# Patient Record
Sex: Female | Born: 1965 | Race: Black or African American | Hispanic: No | Marital: Single | Smoking: Never smoker
Health system: Southern US, Community
[De-identification: ages and names within clinical notes are randomized; demographics above are authoritative.]

## PROBLEM LIST (undated history)

## (undated) DIAGNOSIS — E785 Hyperlipidemia, unspecified: Secondary | ICD-10-CM

## (undated) DIAGNOSIS — K819 Cholecystitis, unspecified: Secondary | ICD-10-CM

## (undated) DIAGNOSIS — I10 Essential (primary) hypertension: Secondary | ICD-10-CM

## (undated) HISTORY — PX: TUBAL LIGATION: SHX77

## (undated) HISTORY — PX: ABDOMINAL HYSTERECTOMY: SHX81

## (undated) HISTORY — DX: Hyperlipidemia, unspecified: E78.5

---

## 2017-08-31 DIAGNOSIS — K819 Cholecystitis, unspecified: Secondary | ICD-10-CM

## 2017-08-31 HISTORY — DX: Cholecystitis, unspecified: K81.9

## 2017-09-23 ENCOUNTER — Encounter (HOSPITAL_COMMUNITY): Payer: Self-pay

## 2017-09-23 ENCOUNTER — Emergency Department (HOSPITAL_COMMUNITY): Payer: PRIVATE HEALTH INSURANCE

## 2017-09-23 ENCOUNTER — Observation Stay (HOSPITAL_COMMUNITY)
Admission: EM | Admit: 2017-09-23 | Discharge: 2017-09-26 | Disposition: A | Discharge status: Home / Self Care | Payer: PRIVATE HEALTH INSURANCE | Attending: Surgery | Admitting: Surgery

## 2017-09-23 DIAGNOSIS — R1011 Right upper quadrant pain: Secondary | ICD-10-CM

## 2017-09-23 DIAGNOSIS — K810 Acute cholecystitis: Secondary | ICD-10-CM | POA: Diagnosis present

## 2017-09-23 DIAGNOSIS — R740 Nonspecific elevation of levels of transaminase and lactic acid dehydrogenase [LDH]: Secondary | ICD-10-CM | POA: Insufficient documentation

## 2017-09-23 DIAGNOSIS — K8020 Calculus of gallbladder without cholecystitis without obstruction: Secondary | ICD-10-CM

## 2017-09-23 DIAGNOSIS — Z01818 Encounter for other preprocedural examination: Secondary | ICD-10-CM

## 2017-09-23 DIAGNOSIS — I10 Essential (primary) hypertension: Secondary | ICD-10-CM | POA: Insufficient documentation

## 2017-09-23 DIAGNOSIS — K8010 Calculus of gallbladder with chronic cholecystitis without obstruction: Principal | ICD-10-CM | POA: Insufficient documentation

## 2017-09-23 HISTORY — DX: Essential (primary) hypertension: I10

## 2017-09-23 HISTORY — DX: Cholecystitis, unspecified: K81.9

## 2017-09-23 LAB — CBC
HCT: 40.3 % (ref 36.0–46.0)
Hemoglobin: 13.2 g/dL (ref 12.0–15.0)
MCH: 27 pg (ref 26.0–34.0)
MCHC: 32.8 g/dL (ref 30.0–36.0)
MCV: 82.4 fL (ref 78.0–100.0)
PLATELETS: 296 10*3/uL (ref 150–400)
RBC: 4.89 MIL/uL (ref 3.87–5.11)
RDW: 15.1 % (ref 11.5–15.5)
WBC: 8 10*3/uL (ref 4.0–10.5)

## 2017-09-23 LAB — COMPREHENSIVE METABOLIC PANEL
ALBUMIN: 3.8 g/dL (ref 3.5–5.0)
ALK PHOS: 73 U/L (ref 38–126)
ALT: 239 U/L — ABNORMAL HIGH (ref 14–54)
AST: 345 U/L — AB (ref 15–41)
Anion gap: 8 (ref 5–15)
BILIRUBIN TOTAL: 2.7 mg/dL — AB (ref 0.3–1.2)
BUN: 10 mg/dL (ref 6–20)
CALCIUM: 9.1 mg/dL (ref 8.9–10.3)
CO2: 24 mmol/L (ref 22–32)
CREATININE: 0.9 mg/dL (ref 0.44–1.00)
Chloride: 103 mmol/L (ref 101–111)
GFR calc Af Amer: >60 mL/min (ref >60)
GFR calc non Af Amer: >60 mL/min (ref >60)
GLUCOSE: 116 mg/dL — AB (ref 65–99)
Potassium: 3.5 mmol/L (ref 3.5–5.1)
Sodium: 135 mmol/L (ref 135–145)
TOTAL PROTEIN: 7.3 g/dL (ref 6.5–8.1)

## 2017-09-23 LAB — I-STAT BETA HCG BLOOD, ED (MC, WL, AP ONLY)

## 2017-09-23 LAB — LIPASE, BLOOD: Lipase: 31 U/L (ref 11–51)

## 2017-09-23 MED ORDER — HYDROCHLOROTHIAZIDE 25 MG PO TABS
25.0000 mg | ORAL_TABLET | Freq: Every day | ORAL | Status: DC
  Administered 2017-09-23 – 2017-09-26 (×4): 25 mg via ORAL
  Filled 2017-09-23 (×4): qty 1

## 2017-09-23 MED ORDER — PROCHLORPERAZINE EDISYLATE 10 MG/2ML IJ SOLN: 5.0000 mg | Freq: Four times a day (QID) | INTRAMUSCULAR | Status: DC | PRN

## 2017-09-23 MED ORDER — PROCHLORPERAZINE MALEATE 10 MG PO TABS
10.0000 mg | ORAL_TABLET | Freq: Four times a day (QID) | ORAL | Status: DC | PRN
  Administered 2017-09-23: 10 mg via ORAL
  Filled 2017-09-23: qty 2
  Filled 2017-09-23: qty 1

## 2017-09-23 MED ORDER — LISINOPRIL 40 MG PO TABS
40.0000 mg | ORAL_TABLET | Freq: Every day | ORAL | Status: DC
  Administered 2017-09-23 – 2017-09-26 (×4): 40 mg via ORAL
  Filled 2017-09-23: qty 1
  Filled 2017-09-23: qty 2
  Filled 2017-09-23 (×2): qty 1

## 2017-09-23 MED ORDER — ONDANSETRON 4 MG PO TBDP
4.0000 mg | ORAL_TABLET | Freq: Once | ORAL | Status: AC
  Administered 2017-09-23: 4 mg via ORAL
  Filled 2017-09-23: qty 1

## 2017-09-23 MED ORDER — SODIUM CHLORIDE 0.9 % IV SOLN
2.0000 g | INTRAVENOUS | Status: DC
  Administered 2017-09-23 – 2017-09-24 (×2): 2 g via INTRAVENOUS
  Filled 2017-09-23 (×3): qty 20

## 2017-09-23 MED ORDER — KCL IN DEXTROSE-NACL 20-5-0.45 MEQ/L-%-% IV SOLN
INTRAVENOUS | Status: DC
  Administered 2017-09-23 – 2017-09-26 (×8): via INTRAVENOUS
  Filled 2017-09-23 (×8): qty 1000

## 2017-09-23 MED ORDER — ENOXAPARIN SODIUM 40 MG/0.4ML ~~LOC~~ SOLN: 40.0000 mg | SUBCUTANEOUS | Status: DC

## 2017-09-23 MED ORDER — ONDANSETRON HCL 4 MG/2ML IJ SOLN
4.0000 mg | Freq: Four times a day (QID) | INTRAMUSCULAR | Status: DC | PRN
  Administered 2017-09-23 – 2017-09-25 (×2): 4 mg via INTRAVENOUS
  Filled 2017-09-23 (×2): qty 2

## 2017-09-23 MED ORDER — FAMOTIDINE IN NACL 20-0.9 MG/50ML-% IV SOLN
20.0000 mg | Freq: Two times a day (BID) | INTRAVENOUS | Status: DC
  Administered 2017-09-23 – 2017-09-24 (×3): 20 mg via INTRAVENOUS
  Filled 2017-09-23 (×3): qty 50

## 2017-09-23 MED ORDER — ONDANSETRON 4 MG PO TBDP: 4.0000 mg | ORAL_TABLET | Freq: Four times a day (QID) | ORAL | Status: DC | PRN

## 2017-09-23 NOTE — ED Provider Notes (Signed)
[facility] Provider Note   CSN: [id number] Arrival date & time: 09/23/17  [id number]   History   Chief Complaint Chief Complaint  Patient presents with  . Abdominal Pain    HPI Savannah Bond is a 52 y.o. female.  HPI   52 year old female presents today with complaints of 2 years of intermittent epigastric pain.  Patient notes the symptoms come after eating and drinking.  She notes that there is severe nature.  Patient notes that the episodes have become more frequent and more severe with longer duration.  Patient notes associated nausea and vomiting.  Patient reports most recent episode started last evening and persisted through the night, she notes significant improvement in her symptoms at the time of my evaluation.  Patient denies any fever.   History reviewed. No pertinent past medical history.  Patient Active Problem List   Diagnosis Date Noted  . Acute cholecystitis 09/23/2017    History reviewed. No pertinent surgical history.   OB History   None      Home Medications    Prior to Admission medications   Medication Sig Start Date End Date Taking? Authorizing Provider  hydrochlorothiazide (HYDRODIURIL) 25 MG tablet Take 25 mg by mouth daily.   Yes [provider]  lisinopril (PRINIVIL,ZESTRIL) 40 MG tablet Take 40 mg by mouth daily.   Yes [provider]    Family History No family history on file.  Social History Social History   Tobacco Use  . Smoking status: Not on file  Substance Use Topics  . Alcohol use: Not on file  . Drug use: Not on file     Allergies   Patient has no known allergies.   Review of Systems Review of Systems  All other systems reviewed and are negative.  Physical Exam Updated Vital Signs BP (!) 157/99 (BP Location: Right Arm)   Pulse 72   Temp 98.8 F (37.1 C) (Oral)   Resp 14   SpO2 99%   Physical Exam  Constitutional: She is oriented to person, place, and  time. She appears well-developed and well-nourished.  HENT:  Head: Normocephalic and atraumatic.  Eyes: Pupils are equal, round, and reactive to light. Conjunctivae are normal. Right eye exhibits no discharge. Left eye exhibits no discharge. No scleral icterus.  Neck: Normal range of motion. No JVD present. No tracheal deviation present.  Pulmonary/Chest: Effort normal. No stridor.  Abdominal: Soft. She exhibits no distension and no mass. There is no tenderness. There is no rebound and no guarding. No hernia.  Neurological: She is alert and oriented to person, place, and time. Coordination normal.  Psychiatric: She has a normal mood and affect. Her behavior is normal. Judgment and thought content normal.  Nursing note and vitals reviewed.   ED Treatments / Results  Labs (all labs ordered are listed, but only abnormal results are displayed) Labs Reviewed  COMPREHENSIVE METABOLIC PANEL - Abnormal; Notable for the following components:      Result Value   Glucose, Bld 116 (*)    AST 345 (*)    ALT 239 (*)    Total Bilirubin 2.7 (*)    All other components within normal limits  LIPASE, BLOOD  CBC  COMPREHENSIVE METABOLIC PANEL  CBC  I-STAT BETA HCG BLOOD, ED ([facility], [facility], [facility] ONLY)    EKG None  Radiology [place] Abdomen Limited Ruq  Result Date: 09/23/2017 CLINICAL DATA:  Right upper quadrant pain with nausea and vomiting EXAM: ULTRASOUND ABDOMEN LIMITED  RIGHT UPPER QUADRANT COMPARISON:  None. FINDINGS: Gallbladder: Within the gallbladder, there are multiple echogenic foci which move and shadow consistent with cholelithiasis. Largest gallstone measures 5 mm in length. The gallbladder wall appears thickened. There is no appreciable pericholecystic fluid. No sonographic Murphy sign noted by sonographer. Common bile duct: Diameter: 5 mm. No intrahepatic or extrahepatic biliary duct dilatation. Liver: No focal lesion identified. Within normal limits in parenchymal echogenicity. Portal vein is patent  on color Doppler imaging with normal direction of blood flow towards the liver. IMPRESSION: Cholelithiasis with gallbladder wall thickening. There may be a degree of acute cholecystitis. This finding may warrant correlation with nuclear medicine hepatobiliary imaging study to assess for cystic duct patency. Study otherwise unremarkable. Electronically Signed   By: Bretta Bang III M.D.   On: 09/23/2017 16:27    Procedures Procedures (including critical care time)  Medications Ordered in ED Medications  hydrochlorothiazide (HYDRODIURIL) tablet 25 mg (25 mg Oral Given 09/23/17 1800)  lisinopril (PRINIVIL,ZESTRIL) tablet 40 mg (40 mg Oral Given 09/23/17 1800)  enoxaparin (LOVENOX) injection 40 mg (has no administration in time range)  dextrose 5 % and 0.45 % NaCl with KCl 20 mEq/L infusion (has no administration in time range)  cefTRIAXone (ROCEPHIN) 2 g in sodium chloride 0.9 % 100 mL IVPB (has no administration in time range)  famotidine (PEPCID) IVPB 20 mg premix (has no administration in time range)  prochlorperazine (COMPAZINE) tablet 10 mg (has no administration in time range)    Or  prochlorperazine (COMPAZINE) injection 5-10 mg (has no administration in time range)  ondansetron (ZOFRAN-ODT) disintegrating tablet 4 mg (has no administration in time range)    Or  ondansetron (ZOFRAN) injection 4 mg (has no administration in time range)  ondansetron (ZOFRAN-ODT) disintegrating tablet 4 mg (4 mg Oral Given 09/23/17 1411)     Initial Impression / Assessment and Plan / ED Course  I have reviewed the triage vital signs and the nursing notes.  Pertinent labs & imaging results that were available during my care of the patient were reviewed by me and considered in my medical decision making (see chart for details).     Labs: I-STAT beta-hCG, lipase, CMP, CBC  Imaging: Ultrasound abdomen limited right upper quadrant  Consults:  Therapeutics: Zofran  Discharge Meds:    Assessment/Plan: 52 year old female presents today with complaints of abdominal pain.  Patient has what appears to be cholelithiasis versus acute cholecystitis.  She is afebrile nontender abdomen with no elevation of white blood cells.  Patient does have findings showing cholelithiasis with gallbladder wall thickening.  There appears to be no dilatation of common bile lower hepatic ducts.  Patient does have significant elevation in AST ALT and bili.  Due to severe pain and findings here general surgery was consulted who will be admitting the patient for ongoing management.  Final Clinical Impressions(s) / ED Diagnoses   Final diagnoses:  RUQ abdominal pain  Preop testing    ED Discharge Orders    None       Rosalio Loud 09/23/17 1803    Benjiman Core, MD 09/23/17 2142

## 2017-09-23 NOTE — Progress Notes (Signed)
Received patient from ED accompanied by daughter. Pt. AOx4, ambulatory, VS stable but BP slightly elevated, denies pain and O2Sat at 99% on RA, oriented to room, bed controls and call light, preop procedure done per order.  Gave some ice chips, will monitor.

## 2017-09-23 NOTE — ED Notes (Signed)
Pt updated on wait time for US, pt verbalized understanding

## 2017-09-23 NOTE — ED Notes (Signed)
This RN attempted Iv access twice without success 

## 2017-09-23 NOTE — ED Notes (Signed)
Iv team at bedside  

## 2017-09-23 NOTE — ED Triage Notes (Signed)
Pt presents for evaluation of abd pain and n/v starting around 0930 PM yesterday. Pt reports pain is constant.

## 2017-09-23 NOTE — ED Notes (Signed)
Trauma MD paged to [place] @ [phone].

## 2017-09-23 NOTE — H&P (Signed)
Savannah Bond is an 52 y.o. female.   Chief Complaint: Abdominal pain. HPI: Patient has had symptoms for the last two years or more, usually with symptoms that last up to 9 hours, she has self treated by modifying her diet and until today, never been seen in the ED.    She has an ultrasound that is suspicious for acute cholecystitis with cholelithiasis.  Her transaminases and Tbili are elevated.  Nl size CBD.  Surgery caalled for consultation and admission  History reviewed. No pertinent past medical history.  History reviewed. No pertinent surgical history.  No family history on file. Social History:  has no tobacco, alcohol, and drug history on file.  Allergies: No Known Allergies   (Not in a hospital admission)  Results for orders placed or performed during the hospital encounter of 09/23/17 (from the past 48 hour(s))  Lipase, blood     Status: None   Collection Time: 09/23/17  9:34 AM  Result Value Ref Range   Lipase 31 11 - 51 U/L    Comment: Performed at [facility], [address] [place], [place] [address]  Comprehensive metabolic panel     Status: Abnormal   Collection Time: 09/23/17  9:34 AM  Result Value Ref Range   Sodium 135 135 - 145 mmol/L   Potassium 3.5 3.5 - 5.1 mmol/L   Chloride 103 101 - 111 mmol/L   CO2 24 22 - 32 mmol/L   Glucose, Bld 116 (H) 65 - 99 mg/dL   BUN 10 6 - 20 mg/dL   Creatinine, Ser 0.90 0.44 - 1.00 mg/dL   Calcium 9.1 8.9 - 10.3 mg/dL   Total Protein 7.3 6.5 - 8.1 g/dL   Albumin 3.8 3.5 - 5.0 g/dL   AST 345 (H) 15 - 41 U/L   ALT 239 (H) 14 - 54 U/L   Alkaline Phosphatase 73 38 - 126 U/L   Total Bilirubin 2.7 (H) 0.3 - 1.2 mg/dL   GFR calc non Af Amer >60 >60 mL/min   GFR calc Af Amer >60 >60 mL/min    Comment: (NOTE) The eGFR has been calculated using the CKD EPI equation. This calculation has not been validated in all clinical situations. eGFR's persistently <60 mL/min signify possible Chronic Kidney Disease.    Anion gap  8 5 - 15    Comment: Performed at [place] [address] [place] [address]  CBC     Status: None   Collection Time: 09/23/17  9:34 AM  Result Value Ref Range   WBC 8.0 4.0 - 10.5 K/uL   RBC 4.89 3.87 - 5.11 MIL/uL   Hemoglobin 13.2 12.0 - 15.0 g/dL   HCT 40.3 36.0 - 46.0 %   MCV 82.4 78.0 - 100.0 fL   MCH 27.0 26.0 - 34.0 pg   MCHC 32.8 30.0 - 36.0 g/dL   RDW 15.1 11.5 - 15.5 %   Platelets 296 150 - 400 K/uL    Comment: Performed at [facility], [place] [address] [place], [place] [address]  I-Stat beta hCG blood, ED     Status: None   Collection Time: 09/23/17  9:51 AM  Result Value Ref Range   I-stat hCG, quantitative <5.0 <5 mIU/mL   Comment 3            Comment:   GEST. AGE      CONC.  (mIU/mL)   <=1 WEEK  5 - 50     2 WEEKS       50 - 500     3 WEEKS       100 - 10,000     4 WEEKS     1,000 - 30,000        FEMALE AND NON-PREGNANT FEMALE:     LESS THAN 5 mIU/mL    US Abdomen Limited Ruq  Result Date: 09/23/2017 CLINICAL DATA:  Right upper quadrant pain with nausea and vomiting EXAM: ULTRASOUND ABDOMEN LIMITED RIGHT UPPER QUADRANT COMPARISON:  None. FINDINGS: Gallbladder: Within the gallbladder, there are multiple echogenic foci which move and shadow consistent with cholelithiasis. Largest gallstone measures 5 mm in length. The gallbladder wall appears thickened. There is no appreciable pericholecystic fluid. No sonographic Murphy sign noted by sonographer. Common bile duct: Diameter: 5 mm. No intrahepatic or extrahepatic biliary duct dilatation. Liver: No focal lesion identified. Within normal limits in parenchymal echogenicity. Portal vein is patent on color Doppler imaging with normal direction of blood flow towards the liver. IMPRESSION: Cholelithiasis with gallbladder wall thickening. There may be a degree of acute cholecystitis. This finding may warrant correlation with nuclear medicine hepatobiliary imaging study to assess for cystic duct  patency. Study otherwise unremarkable. Electronically Signed   By: Lowella Grip III M.D.   On: 09/23/2017 16:27    Review of Systems  Constitutional: Positive for chills. Negative for fever.  HENT: Negative.   Eyes: Negative.   Respiratory: Negative.   Cardiovascular: Negative.   Gastrointestinal: Positive for abdominal pain, nausea and vomiting. Negative for constipation and diarrhea.  Genitourinary: Negative.   Musculoskeletal: Negative.   Skin: Negative.   Neurological: Negative.   Psychiatric/Behavioral: Negative.   All other systems reviewed and are negative.   Blood pressure (!) 143/81, pulse 66, temperature 98.8 F (37.1 C), temperature source Oral, resp. rate 14, SpO2 99 %. Physical Exam  Vitals reviewed. Constitutional: She is oriented to person, place, and time. She appears well-developed.  Obese  HENT:  Head: Normocephalic and atraumatic.  Eyes: Pupils are equal, round, and reactive to light. Conjunctivae and EOM are normal.  Neck: Normal range of motion. Neck supple.  Cardiovascular: Normal rate, regular rhythm and normal heart sounds.  Intermittently tachycardic  Respiratory: Effort normal and breath sounds normal.  GI: Soft. Bowel sounds are decreased. There is tenderness in the epigastric area.  Musculoskeletal: Normal range of motion.  Neurological: She is alert and oriented to person, place, and time. She has normal reflexes.  Skin: Skin is warm and dry.  Psychiatric: She has a normal mood and affect. Her behavior is normal. Judgment and thought content normal.     Assessment/Plan Cholelithiasis, acute cholecystitis with abnormal LFT's.  Normal CBD on ultrasound.    Admit and start on intravenous antibiotics.  Repeat LFT's tomorrow AM.  If better will proceed with cholecystectomy and IOC.  If worse, may need GI consultation and likely MRCP.  Will only start on Rocephin  Judeth Horn, MD 09/23/2017, 5:28 PM

## 2017-09-23 NOTE — ED Notes (Signed)
Patient transported to US 

## 2017-09-24 ENCOUNTER — Other Ambulatory Visit: Payer: Self-pay

## 2017-09-24 ENCOUNTER — Encounter (HOSPITAL_COMMUNITY): Payer: Self-pay | Admitting: *Deleted

## 2017-09-24 ENCOUNTER — Observation Stay (HOSPITAL_COMMUNITY): Payer: PRIVATE HEALTH INSURANCE

## 2017-09-24 LAB — COMPREHENSIVE METABOLIC PANEL
ALT: 242 U/L — AB (ref 14–54)
ANION GAP: 9 (ref 5–15)
AST: 160 U/L — ABNORMAL HIGH (ref 15–41)
Albumin: 3.2 g/dL — ABNORMAL LOW (ref 3.5–5.0)
Alkaline Phosphatase: 99 U/L (ref 38–126)
BUN: 7 mg/dL (ref 6–20)
CHLORIDE: 103 mmol/L (ref 101–111)
CO2: 25 mmol/L (ref 22–32)
CREATININE: 1.03 mg/dL — AB (ref 0.44–1.00)
Calcium: 8.6 mg/dL — ABNORMAL LOW (ref 8.9–10.3)
GFR calc non Af Amer: >60 mL/min (ref >60)
Glucose, Bld: 98 mg/dL (ref 65–99)
Potassium: 3.3 mmol/L — ABNORMAL LOW (ref 3.5–5.1)
SODIUM: 137 mmol/L (ref 135–145)
Total Bilirubin: 2 mg/dL — ABNORMAL HIGH (ref 0.3–1.2)
Total Protein: 6.1 g/dL — ABNORMAL LOW (ref 6.5–8.1)

## 2017-09-24 LAB — CBC
HCT: 36.8 % (ref 36.0–46.0)
HEMOGLOBIN: 11.9 g/dL — AB (ref 12.0–15.0)
MCH: 26.7 pg (ref 26.0–34.0)
MCHC: 32.3 g/dL (ref 30.0–36.0)
MCV: 82.7 fL (ref 78.0–100.0)
Platelets: 247 10*3/uL (ref 150–400)
RBC: 4.45 MIL/uL (ref 3.87–5.11)
RDW: 15.3 % (ref 11.5–15.5)
WBC: 5.4 10*3/uL (ref 4.0–10.5)

## 2017-09-24 LAB — SURGICAL PCR SCREEN
MRSA, PCR: NEGATIVE
Staphylococcus aureus: NEGATIVE

## 2017-09-24 NOTE — Plan of Care (Signed)

## 2017-09-24 NOTE — Progress Notes (Signed)
[facility]/Trauma Progress Note      Assessment/Plan HTN - home meds  Cholelithiasis, acute cholecystitis with abnormal LFTs - AST trending down, ALT slightly up, Tbili down to 2.0 from 2.7 - no WBC - plan for OR tomorrow for laparoscopic cholecystectomy and IOC - Repeat LFTs tomorrow a.m. And if increasing will consult GI  FEN: clears, NPO at midnight, IVF with Kfor mild hypokalemia VTE: SCD's, lovenox ID: Rocephin 04/24>> Foley: None Follow up: TBD  DISPO: a.m. Labs, likely OR tomorrow for laparoscopic cholecystectomy    LOS: 0 days    Subjective: CC: abdominal pain  Pain greatly improved since admission. Mild nausea overnight relieved with meds. No vomiting. No fever or chills. Discussed watching LFTs and likely OR tomorrow.  Objective: Vital signs in last 24 hours: Temp:  [98.4 F (36.9 C)-98.8 F (37.1 C)] 98.4 F (36.9 C) (04/25 0540) Pulse Rate:  [66-77] 77 (04/25 0540) Resp:  [14-18] 17 (04/25 0540) BP: (115-163)/(78-99) 115/78 (04/25 0540) SpO2:  [98 %-99 %] 98 % (04/25 0540) Last BM Date: 09/22/17  Intake/Output from previous day: 04/24 0701 - 04/25 0700 In: 1447.9 [I.V.:1297.9; IV Piggyback:150] Out: 1200 [Urine:1200] Intake/Output this shift: Total I/O In: -  Out: 600 [Urine:600]  PE: Gen:  Alert, NAD, pleasant, cooperative Card:  RRR, no M/G/R heard Pulm:  CTA, no W/R/R, effort normal Abd: Soft, ND, +BS, no HSM, mild TTP RUQ Skin: no rashes noted, warm and dry   Anti-infectives: Anti-infectives (From admission, onward)   Start     Dose/Rate Route Frequency Ordered Stop   09/23/17 1745  cefTRIAXone (ROCEPHIN) 2 g in sodium chloride 0.9 % 100 mL IVPB     2 g 200 mL/hr over 30 Minutes Intravenous Every 24 hours 09/23/17 1731        Lab Results:  Recent Labs    09/23/17 0934 09/24/17 0725  WBC 8.0 5.4  HGB 13.2 11.9*  HCT 40.3 36.8  PLT 296 247   BMET Recent Labs    09/23/17 0934 09/24/17 0725  NA 135 137  K  3.5 3.3*  CL 103 103  CO2 24 25  GLUCOSE 116* 98  BUN 10 7  CREATININE 0.90 1.03*  CALCIUM 9.1 8.6*   PT/INR No results for input(s): LABPROT, INR in the last 72 hours. CMP     Component Value Date/Time   NA 137 09/24/2017 0725   K 3.3 (L) 09/24/2017 0725   CL 103 09/24/2017 0725   CO2 25 09/24/2017 0725   GLUCOSE 98 09/24/2017 0725   BUN 7 09/24/2017 0725   CREATININE 1.03 (H) 09/24/2017 0725   CALCIUM 8.6 (L) 09/24/2017 0725   PROT 6.1 (L) 09/24/2017 0725   ALBUMIN 3.2 (L) 09/24/2017 0725   AST 160 (H) 09/24/2017 0725   ALT 242 (H) 09/24/2017 0725   ALKPHOS 99 09/24/2017 0725   BILITOT 2.0 (H) 09/24/2017 0725   GFRNONAA >60 09/24/2017 0725   GFRAA >60 09/24/2017 0725   Lipase     Component Value Date/Time   LIPASE 31 09/23/2017 0934    Studies/Results: Dg Chest 2 View  Result Date: 09/24/2017 CLINICAL DATA:  Preop cholecystectomy EXAM: CHEST - 2 VIEW COMPARISON:  None. FINDINGS: Left base atelectasis or scarring. Right lung clear. Heart is normal size. No effusions or acute bony abnormality. IMPRESSION: Left base atelectasis or scarring. Electronically Signed   By: Charlett Nose M.D.   On: 09/24/2017 09:59   US Abdomen Limited Ruq  Result Date: 09/23/2017 CLINICAL DATA:  Right upper quadrant pain with nausea and vomiting EXAM: ULTRASOUND ABDOMEN LIMITED RIGHT UPPER QUADRANT COMPARISON:  None. FINDINGS: Gallbladder: Within the gallbladder, there are multiple echogenic foci which move and shadow consistent with cholelithiasis. Largest gallstone measures 5 mm in length. The gallbladder wall appears thickened. There is no appreciable pericholecystic fluid. No sonographic Murphy sign noted by sonographer. Common bile duct: Diameter: 5 mm. No intrahepatic or extrahepatic biliary duct dilatation. Liver: No focal lesion identified. Within normal limits in parenchymal echogenicity. Portal vein is patent on color Doppler imaging with normal direction of blood flow towards the liver.  IMPRESSION: Cholelithiasis with gallbladder wall thickening. There may be a degree of acute cholecystitis. This finding may warrant correlation with nuclear medicine hepatobiliary imaging study to assess for cystic duct patency. Study otherwise unremarkable. Electronically Signed   By: Bretta BangWilliam  Woodruff III M.D.   On: 09/23/2017 16:27      Jerre SimonJessica L Nastasia Kage , [facility] 09/24/2017, 10:16 AM  Pager: [phone] Mon-Wed, Friday 7:00am-4:30pm Thurs 7am-11:30am  Consults: [phone]

## 2017-09-25 ENCOUNTER — Observation Stay (HOSPITAL_COMMUNITY): Payer: PRIVATE HEALTH INSURANCE

## 2017-09-25 ENCOUNTER — Observation Stay (HOSPITAL_COMMUNITY): Payer: PRIVATE HEALTH INSURANCE | Admitting: Certified Registered"

## 2017-09-25 ENCOUNTER — Encounter (HOSPITAL_COMMUNITY)
Admission: EM | Disposition: A | Discharge status: Home / Self Care | Payer: Self-pay | Source: Home / Self Care | Attending: Emergency Medicine

## 2017-09-25 ENCOUNTER — Encounter (HOSPITAL_COMMUNITY): Payer: Self-pay | Admitting: Certified Registered"

## 2017-09-25 HISTORY — PX: CHOLECYSTECTOMY: SHX55

## 2017-09-25 LAB — COMPREHENSIVE METABOLIC PANEL
ALT: 169 U/L — ABNORMAL HIGH (ref 14–54)
ANION GAP: 7 (ref 5–15)
AST: 57 U/L — ABNORMAL HIGH (ref 15–41)
Albumin: 3.2 g/dL — ABNORMAL LOW (ref 3.5–5.0)
Alkaline Phosphatase: 93 U/L (ref 38–126)
BUN: 5 mg/dL — ABNORMAL LOW (ref 6–20)
CHLORIDE: 103 mmol/L (ref 101–111)
CO2: 28 mmol/L (ref 22–32)
Calcium: 8.6 mg/dL — ABNORMAL LOW (ref 8.9–10.3)
Creatinine, Ser: 1.07 mg/dL — ABNORMAL HIGH (ref 0.44–1.00)
GFR, EST NON AFRICAN AMERICAN: 59 mL/min — AB (ref >60)
Glucose, Bld: 95 mg/dL (ref 65–99)
POTASSIUM: 3.2 mmol/L — AB (ref 3.5–5.1)
Sodium: 138 mmol/L (ref 135–145)
TOTAL PROTEIN: 6.2 g/dL — AB (ref 6.5–8.1)
Total Bilirubin: 1.1 mg/dL (ref 0.3–1.2)

## 2017-09-25 LAB — CBC
HEMATOCRIT: 37.9 % (ref 36.0–46.0)
Hemoglobin: 12.5 g/dL (ref 12.0–15.0)
MCH: 27.7 pg (ref 26.0–34.0)
MCHC: 33 g/dL (ref 30.0–36.0)
MCV: 83.8 fL (ref 78.0–100.0)
Platelets: 255 10*3/uL (ref 150–400)
RBC: 4.52 MIL/uL (ref 3.87–5.11)
RDW: 15.6 % — ABNORMAL HIGH (ref 11.5–15.5)
WBC: 5.4 10*3/uL (ref 4.0–10.5)

## 2017-09-25 SURGERY — LAPAROSCOPIC CHOLECYSTECTOMY WITH INTRAOPERATIVE CHOLANGIOGRAM
Anesthesia: General | Site: Abdomen

## 2017-09-25 MED ORDER — ONDANSETRON HCL 4 MG/2ML IJ SOLN
INTRAMUSCULAR | Status: AC
  Filled 2017-09-25: qty 2

## 2017-09-25 MED ORDER — DEXAMETHASONE SODIUM PHOSPHATE 10 MG/ML IJ SOLN
INTRAMUSCULAR | Status: DC | PRN
  Administered 2017-09-25: 10 mg via INTRAVENOUS

## 2017-09-25 MED ORDER — ENOXAPARIN SODIUM 40 MG/0.4ML ~~LOC~~ SOLN
40.0000 mg | SUBCUTANEOUS | Status: DC
  Administered 2017-09-26: 40 mg via SUBCUTANEOUS
  Filled 2017-09-25: qty 0.4

## 2017-09-25 MED ORDER — MIDAZOLAM HCL 2 MG/2ML IJ SOLN
INTRAMUSCULAR | Status: DC | PRN
  Administered 2017-09-25: 2 mg via INTRAVENOUS

## 2017-09-25 MED ORDER — IOPAMIDOL (ISOVUE-300) INJECTION 61%
INTRAVENOUS | Status: DC | PRN
  Administered 2017-09-25: 15 mL via INTRAVENOUS

## 2017-09-25 MED ORDER — OXYCODONE HCL 5 MG PO TABS
5.0000 mg | ORAL_TABLET | ORAL | Status: DC | PRN
  Administered 2017-09-25 (×2): 5 mg via ORAL
  Filled 2017-09-25 (×2): qty 1

## 2017-09-25 MED ORDER — FENTANYL CITRATE (PF) 100 MCG/2ML IJ SOLN
INTRAMUSCULAR | Status: AC
  Filled 2017-09-25: qty 2

## 2017-09-25 MED ORDER — SUGAMMADEX SODIUM 200 MG/2ML IV SOLN
INTRAVENOUS | Status: AC
  Filled 2017-09-25: qty 2

## 2017-09-25 MED ORDER — 0.9 % SODIUM CHLORIDE (POUR BTL) OPTIME
TOPICAL | Status: DC | PRN
  Administered 2017-09-25: 1000 mL

## 2017-09-25 MED ORDER — STERILE WATER FOR IRRIGATION IR SOLN
Status: DC | PRN
  Administered 2017-09-25: 1000 mL

## 2017-09-25 MED ORDER — ESMOLOL HCL 100 MG/10ML IV SOLN
INTRAVENOUS | Status: AC
  Filled 2017-09-25: qty 10

## 2017-09-25 MED ORDER — MORPHINE SULFATE (PF) 4 MG/ML IV SOLN
1.0000 mg | INTRAVENOUS | Status: DC | PRN
  Administered 2017-09-25: 1 mg via INTRAVENOUS
  Filled 2017-09-25: qty 1

## 2017-09-25 MED ORDER — LIDOCAINE 2% (20 MG/ML) 5 ML SYRINGE
INTRAMUSCULAR | Status: AC
  Filled 2017-09-25: qty 5

## 2017-09-25 MED ORDER — FENTANYL CITRATE (PF) 250 MCG/5ML IJ SOLN
INTRAMUSCULAR | Status: AC
  Filled 2017-09-25: qty 5

## 2017-09-25 MED ORDER — ROCURONIUM BROMIDE 10 MG/ML (PF) SYRINGE
PREFILLED_SYRINGE | INTRAVENOUS | Status: DC | PRN
  Administered 2017-09-25: 45 mg via INTRAVENOUS

## 2017-09-25 MED ORDER — PROPOFOL 10 MG/ML IV BOLUS
INTRAVENOUS | Status: DC | PRN
  Administered 2017-09-25: 150 mg via INTRAVENOUS

## 2017-09-25 MED ORDER — ONDANSETRON HCL 4 MG/2ML IJ SOLN
INTRAMUSCULAR | Status: DC | PRN
  Administered 2017-09-25: 4 mg via INTRAVENOUS

## 2017-09-25 MED ORDER — LACTATED RINGERS IV SOLN
INTRAVENOUS | Status: DC
  Administered 2017-09-25: 09:00:00 via INTRAVENOUS

## 2017-09-25 MED ORDER — SODIUM CHLORIDE 0.9 % IR SOLN
Status: DC | PRN
  Administered 2017-09-25: 1000 mL

## 2017-09-25 MED ORDER — FENTANYL CITRATE (PF) 100 MCG/2ML IJ SOLN
25.0000 ug | INTRAMUSCULAR | Status: DC | PRN
  Administered 2017-09-25: 50 ug via INTRAVENOUS

## 2017-09-25 MED ORDER — BUPIVACAINE-EPINEPHRINE 0.25% -1:200000 IJ SOLN
INTRAMUSCULAR | Status: DC | PRN
  Administered 2017-09-25: 8 mL

## 2017-09-25 MED ORDER — IBUPROFEN 400 MG PO TABS: 400.0000 mg | ORAL_TABLET | Freq: Four times a day (QID) | ORAL | Status: DC | PRN

## 2017-09-25 MED ORDER — IOPAMIDOL (ISOVUE-300) INJECTION 61%
INTRAVENOUS | Status: AC
  Filled 2017-09-25: qty 50

## 2017-09-25 MED ORDER — LIDOCAINE 2% (20 MG/ML) 5 ML SYRINGE
INTRAMUSCULAR | Status: DC | PRN
  Administered 2017-09-25: 60 mg via INTRAVENOUS

## 2017-09-25 MED ORDER — TRAMADOL HCL 50 MG PO TABS
50.0000 mg | ORAL_TABLET | Freq: Four times a day (QID) | ORAL | Status: DC | PRN
  Administered 2017-09-25 – 2017-09-26 (×2): 50 mg via ORAL
  Filled 2017-09-25 (×2): qty 1

## 2017-09-25 MED ORDER — BUPIVACAINE-EPINEPHRINE (PF) 0.25% -1:200000 IJ SOLN
INTRAMUSCULAR | Status: AC
  Filled 2017-09-25: qty 30

## 2017-09-25 MED ORDER — PROPOFOL 10 MG/ML IV BOLUS
INTRAVENOUS | Status: AC
  Filled 2017-09-25: qty 20

## 2017-09-25 MED ORDER — MIDAZOLAM HCL 2 MG/2ML IJ SOLN
INTRAMUSCULAR | Status: AC
  Filled 2017-09-25: qty 2

## 2017-09-25 MED ORDER — FENTANYL CITRATE (PF) 100 MCG/2ML IJ SOLN
INTRAMUSCULAR | Status: DC | PRN
  Administered 2017-09-25: 100 ug via INTRAVENOUS
  Administered 2017-09-25: 50 ug via INTRAVENOUS
  Administered 2017-09-25: 150 ug via INTRAVENOUS
  Administered 2017-09-25: 100 ug via INTRAVENOUS

## 2017-09-25 MED ORDER — ROCURONIUM BROMIDE 10 MG/ML (PF) SYRINGE
PREFILLED_SYRINGE | INTRAVENOUS | Status: AC
Start: 2017-09-25 — End: ?
  Filled 2017-09-25: qty 5

## 2017-09-25 MED ORDER — ACETAMINOPHEN 325 MG PO TABS: 650.0000 mg | ORAL_TABLET | Freq: Four times a day (QID) | ORAL | Status: DC | PRN

## 2017-09-25 MED ORDER — SUGAMMADEX SODIUM 200 MG/2ML IV SOLN
INTRAVENOUS | Status: DC | PRN
  Administered 2017-09-25: 180 mg via INTRAVENOUS

## 2017-09-25 MED ORDER — ESMOLOL HCL 100 MG/10ML IV SOLN
INTRAVENOUS | Status: DC | PRN
  Administered 2017-09-25 (×2): 20 mg via INTRAVENOUS
  Administered 2017-09-25: 10 mg via INTRAVENOUS

## 2017-09-25 SURGICAL SUPPLY — 39 items
APPLIER CLIP 5 13 M/L LIGAMAX5 (MISCELLANEOUS) ×3
CANISTER SUCT 3000ML PPV (MISCELLANEOUS) ×3 IMPLANT
CHLORAPREP W/TINT 26ML (MISCELLANEOUS) ×3 IMPLANT
CLIP APPLIE 5 13 M/L LIGAMAX5 (MISCELLANEOUS) ×1 IMPLANT
COVER MAYO STAND STRL (DRAPES) ×3 IMPLANT
COVER SURGICAL LIGHT HANDLE (MISCELLANEOUS) ×3 IMPLANT
DERMABOND ADVANCED (GAUZE/BANDAGES/DRESSINGS) ×2
DERMABOND ADVANCED .7 DNX12 (GAUZE/BANDAGES/DRESSINGS) ×1 IMPLANT
DRAPE C-ARM 35X43 STRL (DRAPES) ×3 IMPLANT
ELECT REM PT RETURN 9FT ADLT (ELECTROSURGICAL) ×3
ELECTRODE REM PT RTRN 9FT ADLT (ELECTROSURGICAL) ×1 IMPLANT
GLOVE BIO SURGEON STRL SZ 6 (GLOVE) ×3 IMPLANT
GLOVE BIOGEL PI IND STRL 6.5 (GLOVE) ×1 IMPLANT
GLOVE BIOGEL PI IND STRL 7.0 (GLOVE) ×1 IMPLANT
GLOVE BIOGEL PI INDICATOR 6.5 (GLOVE) ×2
GLOVE BIOGEL PI INDICATOR 7.0 (GLOVE) ×2
GLOVE SS BIOGEL STRL SZ 7 (GLOVE) ×1 IMPLANT
GLOVE SUPERSENSE BIOGEL SZ 7 (GLOVE) ×2
GOWN STRL REUS W/ TWL LRG LVL3 (GOWN DISPOSABLE) ×3 IMPLANT
GOWN STRL REUS W/TWL LRG LVL3 (GOWN DISPOSABLE) ×6
GRASPER SUT TROCAR 14GX15 (MISCELLANEOUS) ×3 IMPLANT
KIT BASIN OR (CUSTOM PROCEDURE TRAY) ×3 IMPLANT
KIT TURNOVER KIT B (KITS) ×3 IMPLANT
NEEDLE INSUFFLATION 14GA 120MM (NEEDLE) ×3 IMPLANT
NS IRRIG 1000ML POUR BTL (IV SOLUTION) ×3 IMPLANT
PAD ARMBOARD 7.5X6 YLW CONV (MISCELLANEOUS) ×3 IMPLANT
POUCH SPECIMEN RETRIEVAL 10MM (ENDOMECHANICALS) ×3 IMPLANT
SCISSORS LAP 5X35 DISP (ENDOMECHANICALS) ×3 IMPLANT
SET CHOLANGIOGRAPH 5 50 .035 (SET/KITS/TRAYS/PACK) ×3 IMPLANT
SET IRRIG TUBING LAPAROSCOPIC (IRRIGATION / IRRIGATOR) ×3 IMPLANT
SLEEVE ENDOPATH XCEL 5M (ENDOMECHANICALS) ×3 IMPLANT
SPECIMEN JAR SMALL (MISCELLANEOUS) ×3 IMPLANT
SUT MNCRL AB 4-0 PS2 18 (SUTURE) ×3 IMPLANT
TOWEL OR 17X24 6PK STRL BLUE (TOWEL DISPOSABLE) ×3 IMPLANT
TRAY LAPAROSCOPIC MC (CUSTOM PROCEDURE TRAY) ×3 IMPLANT
TROCAR XCEL NON-BLD 11X100MML (ENDOMECHANICALS) ×3 IMPLANT
TROCAR XCEL NON-BLD 5MMX100MML (ENDOMECHANICALS) ×3 IMPLANT
TUBING INSUFFLATION (TUBING) ×3 IMPLANT
WATER STERILE IRR 1000ML POUR (IV SOLUTION) ×3 IMPLANT

## 2017-09-25 NOTE — Anesthesia Postprocedure Evaluation (Signed)
Anesthesia Post Note  Patient: Savannah Bond  Procedure(s) Performed: LAPAROSCOPIC CHOLECYSTECTOMY WITH INTRAOPERATIVE CHOLANGIOGRAM (N/A Abdomen)     Patient location during evaluation: PACU Anesthesia Type: General Level of consciousness: awake Pain management: pain level controlled Vital Signs Assessment: post-procedure vital signs reviewed and stable Respiratory status: spontaneous breathing Cardiovascular status: stable Anesthetic complications: no    Last Vitals:  Vitals:   09/25/17 1054 09/25/17 1101  BP:  (!) 156/96  Pulse: 94 72  Resp: (!) 22 12  Temp:  36.4 C  SpO2: 97% 96%    Last Pain:  Vitals:   09/25/17 1054  TempSrc:   PainSc: 9                  Carie Kapuscinski

## 2017-09-25 NOTE — Transfer of Care (Signed)
Immediate Anesthesia Transfer of Care Note  Patient: Savannah Bond  Procedure(s) Performed: LAPAROSCOPIC CHOLECYSTECTOMY WITH INTRAOPERATIVE CHOLANGIOGRAM (N/A Abdomen)  Patient Location: PACU  Anesthesia Type:General  Level of Consciousness: drowsy and patient cooperative  Airway & Oxygen Therapy: Patient Spontanous Breathing and Patient connected to nasal cannula oxygen  Post-op Assessment: Report given to RN  Post vital signs: Reviewed and stable  Last Vitals:  Vitals Value Taken Time  BP    Temp    Pulse 100 09/25/2017 10:31 AM  Resp 18 09/25/2017 10:31 AM  SpO2 99 % 09/25/2017 10:31 AM  Vitals shown include unvalidated device data.  Last Pain:  Vitals:   09/25/17 0641  TempSrc: Oral  PainSc:          Complications: No apparent anesthesia complications

## 2017-09-25 NOTE — Progress Notes (Addendum)
0750 Pt to pre op via bed. NPO post mn maint. Daughter present.  1130 Received pt from PACU, A&O x4. C/o surgical pain.

## 2017-09-25 NOTE — Progress Notes (Signed)
[facility]/Trauma Progress Note  Day of Surgery   Assessment/Plan HTN - home meds  Cholelithiasis, acute cholecystitis with abnormal LFTs - LFTs downtrending - normal WBC - plan for OR today for laparoscopic cholecystectomy and IOC- I discussed the procedure in detail with her including the risks involved, she has expressed an understanding and has had her questions answered to her satisfaction.   FEN: npo VTE: SCD's, lovenox ID: Rocephin 04/24>> Foley: None Follow up: TBD  DISPO: OR today    LOS: 0 days    Subjective: CC: abdominal pain  No acute changes  Objective: Vital signs in last 24 hours: Temp:  [98.3 F (36.8 C)-98.6 F (37 C)] 98.5 F (36.9 C) (04/26 0641) Pulse Rate:  [72-78] 72 (04/26 0641) Resp:  [18] 18 (04/26 0641) BP: (108-140)/(70-95) 127/95 (04/26 0641) SpO2:  [99 %-100 %] 99 % (04/26 0641) Last BM Date: 09/22/17  Intake/Output from previous day: 04/25 0701 - 04/26 0700 In: 3299.2 [P.O.:120; I.V.:2929.2; IV Piggyback:250] Out: 600 [Urine:600] Intake/Output this shift: No intake/output data recorded.  PE: Gen:  Alert, NAD, pleasant, cooperative Card:  RRR, no M/G/R heard Pulm:  CTA, no W/R/R, effort normal Abd: Soft, ND, +BS, no HSM, mild TTP RUQ Skin: no rashes noted, warm and dry   Anti-infectives: Anti-infectives (From admission, onward)   Start     Dose/Rate Route Frequency Ordered Stop   09/23/17 1745  cefTRIAXone (ROCEPHIN) 2 g in sodium chloride 0.9 % 100 mL IVPB     2 g 200 mL/hr over 30 Minutes Intravenous Every 24 hours 09/23/17 1731        Lab Results:  Recent Labs    09/24/17 0725 09/25/17 0517  WBC 5.4 5.4  HGB 11.9* 12.5  HCT 36.8 37.9  PLT 247 255   BMET Recent Labs    09/24/17 0725 09/25/17 0517  NA 137 138  K 3.3* 3.2*  CL 103 103  CO2 25 28  GLUCOSE 98 95  BUN 7 5*  CREATININE 1.03* 1.07*  CALCIUM 8.6* 8.6*   PT/INR No results for input(s): LABPROT, INR in the last 72  hours. CMP     Component Value Date/Time   NA 138 09/25/2017 0517   K 3.2 (L) 09/25/2017 0517   CL 103 09/25/2017 0517   CO2 28 09/25/2017 0517   GLUCOSE 95 09/25/2017 0517   BUN 5 (L) 09/25/2017 0517   CREATININE 1.07 (H) 09/25/2017 0517   CALCIUM 8.6 (L) 09/25/2017 0517   PROT 6.2 (L) 09/25/2017 0517   ALBUMIN 3.2 (L) 09/25/2017 0517   AST 57 (H) 09/25/2017 0517   ALT 169 (H) 09/25/2017 0517   ALKPHOS 93 09/25/2017 0517   BILITOT 1.1 09/25/2017 0517   GFRNONAA 59 (L) 09/25/2017 0517   GFRAA >60 09/25/2017 0517   Lipase     Component Value Date/Time   LIPASE 31 09/23/2017 0934    Studies/Results: Dg Chest 2 View  Result Date: 09/24/2017 CLINICAL DATA:  Preop cholecystectomy EXAM: CHEST - 2 VIEW COMPARISON:  None. FINDINGS: Left base atelectasis or scarring. Right lung clear. Heart is normal size. No effusions or acute bony abnormality. IMPRESSION: Left base atelectasis or scarring. Electronically Signed   By: Charlett NoseKevin  Dover M.D.   On: 09/24/2017 09:59   [place] Abdomen Limited Ruq  Result Date: 09/23/2017 CLINICAL DATA:  Right upper quadrant pain with nausea and vomiting EXAM: ULTRASOUND ABDOMEN LIMITED RIGHT UPPER QUADRANT COMPARISON:  None. FINDINGS: Gallbladder: Within the gallbladder, there are multiple echogenic foci which  move and shadow consistent with cholelithiasis. Largest gallstone measures 5 mm in length. The gallbladder wall appears thickened. There is no appreciable pericholecystic fluid. No sonographic Murphy sign noted by sonographer. Common bile duct: Diameter: 5 mm. No intrahepatic or extrahepatic biliary duct dilatation. Liver: No focal lesion identified. Within normal limits in parenchymal echogenicity. Portal vein is patent on color Doppler imaging with normal direction of blood flow towards the liver. IMPRESSION: Cholelithiasis with gallbladder wall thickening. There may be a degree of acute cholecystitis. This finding may warrant correlation with nuclear medicine  hepatobiliary imaging study to assess for cystic duct patency. Study otherwise unremarkable. Electronically Signed   By: Bretta Bang III M.D.   On: 09/23/2017 16:27      Berna Bue , MD [facility] 09/25/2017, 8:08 AM

## 2017-09-25 NOTE — Anesthesia Preprocedure Evaluation (Addendum)
Anesthesia Evaluation  Patient identified by MRN, date of birth, ID band  Reviewed: Allergy & Precautions, NPO status , Patient's Chart, lab work & pertinent test results  Airway Mallampati: II  TM Distance: >3 FB     Dental  (+) Teeth Intact, Missing, Dental Advisory Given   Pulmonary neg pulmonary ROS,    breath sounds clear to auscultation       Cardiovascular hypertension,  Rhythm:Regular Rate:Normal     Neuro/Psych    GI/Hepatic negative GI ROS, Neg liver ROS,   Endo/Other  negative endocrine ROS  Renal/GU negative Renal ROS     Musculoskeletal   Abdominal   Peds  Hematology   Anesthesia Other Findings   Reproductive/Obstetrics                            Anesthesia Physical Anesthesia Plan  ASA: III  Anesthesia Plan: General   Post-op Pain Management:    Induction: Intravenous  PONV Risk Score and Plan: 3 and Treatment may vary due to age or medical condition  Airway Management Planned: Oral ETT  Additional Equipment:   Intra-op Plan:   Post-operative Plan: Extubation in OR  Informed Consent: I have reviewed the patients History and Physical, chart, labs and discussed the procedure including the risks, benefits and alternatives for the proposed anesthesia with the patient or authorized representative who has indicated his/her understanding and acceptance.   Dental advisory given  Plan Discussed with: CRNA and Anesthesiologist  Anesthesia Plan Comments:         Anesthesia Quick Evaluation

## 2017-09-25 NOTE — Discharge Instructions (Signed)

## 2017-09-25 NOTE — Op Note (Signed)
Operative Note  Savannah Bond 52 y.o. female [id number]  09/25/2017  Surgeon: Berna Buehelsea A Bailee Metter MD  Assistant: Wells GuilesKelly Rayburn PA-C  Procedure performed: Laparoscopic Cholecystectomy with intraoperative cholangiogram  Preop diagnosis: cholecystitis, elevated LFT Post-op diagnosis/intraop findings: Minimal inflammation of gallbladder, normal cholangiogram  Specimens: gallbladder  EBL: minimal  Complications: none  Description of procedure: After obtaining informed consent the patient was brought to the operating room. Prophylactic antibiotics and subcutaneous heparin were administered. SCD's were applied. General endotracheal anesthesia was initiated and a formal time-out was performed. The abdomen was prepped and draped in the usual sterile fashion and the abdomen was entered using visiport technique in the left upper quadrant after instilling the site with local. Insufflation to 15mmHg was obtained and gross inspection revealed no evidence of injury from our entry or other intraabdominal abnormalities. Two 5mm trocars were introduced in the supraumbilical and right anterior axillary lines under direct visualization and following infiltration with local. An 11mm trocar was placed in the right midclavicular line. The gallbladder was retracted cephalad and the infundibulum was retracted laterally. A combination of hook electrocautery and blunt dissection was utilized to clear the peritoneum from the neck and cystic duct, circumferentially isolating the cystic artery and cystic duct and lifting the gallbladder from the cystic plate. The critical view of safety was achieved with the cystic artery, cystic duct, and liver bed visualized between them with no other structures. The artery was clipped with a two clips proximally and one distally and divided. A clip was placed on the distal cystic duct and a ductotomy made. The cook catheter was inserted into the cystic duct and secured with a clip. The  cholangiogram was performed with easy filling of the biliary tree, quick empyting into the duodenum and no obvious obstructing lesion. The catheter was removed, three clips placed on the proximal cystic duct and division completed. The gallbladder was dissected from the liver plate using electrocautery. Once freed the gallbladder was placed in an endocatch bag and removed through the 11mm trocar site. Some bile had been spilled from the gallbladder during its dissection from the liver bed. This was aspirated and the right upper quadrant was irrigated copiously until the effluent was clear. Hemostasis was once again confirmed, and reinspection of the abdomen revealed no injuries. The clips were well opposed without any bile leak from the duct or the liver bed. The 11mm trocar site in the right midclavicular line was closed with two 0 vicryls in the fascia under direct visualization using a PMI device. The abdomen was desufflated and all trocars removed. The skin incisions were closed with running subcuticular monocryl and Dermabond. The patient was awakened, extubated and transported to the recovery room in stable condition.   All counts were correct at the completion of the case.

## 2017-09-25 NOTE — Anesthesia Procedure Notes (Signed)
Procedure Name: Intubation Date/Time: 09/25/2017 9:27 AM Performed by: Barrington Ellison, CRNA Pre-anesthesia Checklist: Patient identified, Emergency Drugs available, Suction available and Patient being monitored Patient Re-evaluated:Patient Re-evaluated prior to induction Oxygen Delivery Method: Circle System Utilized Preoxygenation: Pre-oxygenation with 100% oxygen Induction Type: IV induction Ventilation: Mask ventilation without difficulty Laryngoscope Size: Mac and 3 Grade View: Grade I Tube type: Oral Tube size: 7.0 mm Number of attempts: 1 Airway Equipment and Method: Stylet and Oral airway Placement Confirmation: ETT inserted through vocal cords under direct vision,  positive ETCO2 and breath sounds checked- equal and bilateral Secured at: 22 cm Tube secured with: Tape Dental Injury: Teeth and Oropharynx as per pre-operative assessment

## 2017-09-26 ENCOUNTER — Encounter (HOSPITAL_COMMUNITY): Payer: Self-pay | Admitting: Surgery

## 2017-09-26 LAB — CBC
HEMATOCRIT: 38.1 % (ref 36.0–46.0)
HEMOGLOBIN: 12.5 g/dL (ref 12.0–15.0)
MCH: 27.3 pg (ref 26.0–34.0)
MCHC: 32.8 g/dL (ref 30.0–36.0)
MCV: 83.2 fL (ref 78.0–100.0)
Platelets: 295 10*3/uL (ref 150–400)
RBC: 4.58 MIL/uL (ref 3.87–5.11)
RDW: 15.5 % (ref 11.5–15.5)
WBC: 10.3 10*3/uL (ref 4.0–10.5)

## 2017-09-26 LAB — COMPREHENSIVE METABOLIC PANEL
ALT: 126 U/L — ABNORMAL HIGH (ref 14–54)
ANION GAP: 7 (ref 5–15)
AST: 37 U/L (ref 15–41)
Albumin: 3.2 g/dL — ABNORMAL LOW (ref 3.5–5.0)
Alkaline Phosphatase: 85 U/L (ref 38–126)
BILIRUBIN TOTAL: 1 mg/dL (ref 0.3–1.2)
BUN: 5 mg/dL — ABNORMAL LOW (ref 6–20)
CO2: 26 mmol/L (ref 22–32)
Calcium: 8.9 mg/dL (ref 8.9–10.3)
Chloride: 104 mmol/L (ref 101–111)
Creatinine, Ser: 0.92 mg/dL (ref 0.44–1.00)
GFR calc Af Amer: >60 mL/min (ref >60)
Glucose, Bld: 117 mg/dL — ABNORMAL HIGH (ref 65–99)
POTASSIUM: 3.5 mmol/L (ref 3.5–5.1)
Sodium: 137 mmol/L (ref 135–145)
TOTAL PROTEIN: 6.2 g/dL — AB (ref 6.5–8.1)

## 2017-09-26 MED ORDER — HYDROCODONE-ACETAMINOPHEN 5-325 MG PO TABS: 1.0000 | ORAL_TABLET | Freq: Four times a day (QID) | ORAL | 0 refills | Status: DC | PRN

## 2017-09-26 NOTE — Discharge Summary (Signed)
  Patient ID: Savannah Bond [id number] 52 y.o. 1966-04-11  Admit date: 09/23/2017  Discharge date and time: 09/26/2017  Admitting Physician: Jimmye Norman  Discharge Physician: Ernestene Mention  Admission Diagnoses: RUQ abdominal pain [R10.11] Preop testing [Z01.818]  Discharge Diagnoses: Acute cholecystitis with cholelithiasis  Operations: Procedure(s): LAPAROSCOPIC CHOLECYSTECTOMY WITH INTRAOPERATIVE CHOLANGIOGRAM  Admission Condition: fair  Discharged Condition: good  Indication for Admission: Patient has had symptoms for the last two years or more, usually with symptoms that last up to 9 hours, she has self treated by modifying her diet and until today, never been seen in the ED.    She has an ultrasound that is suspicious for acute cholecystitis with cholelithiasis.  Her transaminases and Tbili are elevated.  Nl size CBD.  Surgery caalled for consultation and admission    Hospital Course: The patient was evaluated and admitted by Dr. Ilda Basset.  Started on IV fluids, antiemetics, analgesics, and antibiotics.  The patient was taken to the operating room on April 26 and underwent laparoscopic cholecystectomy with cholangiogram.  The cholangiogram was normal.  She was observed overnight and did very well.  She progressed in her diet and activities without any difficulty.  On postop day 1 she was having minimal pain and wanted to go home.  Abdomen was soft and minimally tender.  Wounds look fine.  She asked for a prescription for pain medicine and I called in a prescription for Norco, 20 tablets, to her pharmacy.  Diet and activities were discussed.  She was given a follow-up appointment at [facility] surgery on May 9.  Consults: None  Significant Diagnostic Studies: Lab work, imaging studies, surgical pathology  Treatments: surgery: Laparoscopic cholecystectomy with cholangiogram  Disposition: Home  Patient Instructions:  Allergies as of 09/26/2017   No Known  Allergies     Medication List    TAKE these medications   hydrochlorothiazide 25 MG tablet Commonly known as:  HYDRODIURIL Take 25 mg by mouth daily.   HYDROcodone-acetaminophen 5-325 MG tablet Commonly known as:  NORCO Take 1-2 tablets by mouth every 6 (six) hours as needed for moderate pain or severe pain.   lisinopril 40 MG tablet Commonly known as:  PRINIVIL,ZESTRIL Take 40 mg by mouth daily.       Activity: No sports or heavy lifting for 3 weeks Diet: low fat, low cholesterol diet Wound Care: none needed  Follow-up:  With [facility] surgery in 3 weeks   Addendum: I logged into the [employer] and reviewed her prescription medication history .  Signed: Angelia Mould. Derrell Lolling, M.D., FACS General and minimally invasive surgery Breast and Colorectal Surgery  09/26/2017, 9:14 AM

## 2018-07-19 IMAGING — US US ABDOMEN LIMITED
1 series · 14 of 25 positions shown · non-contrast
Comparison: None.

CLINICAL DATA: Right upper quadrant pain with nausea and vomiting

EXAM:
ULTRASOUND ABDOMEN LIMITED RIGHT UPPER QUADRANT

[Series 1: us abdomen limited · 0.16mm/px · 14 of 43 slices shown]
[im 1/43]
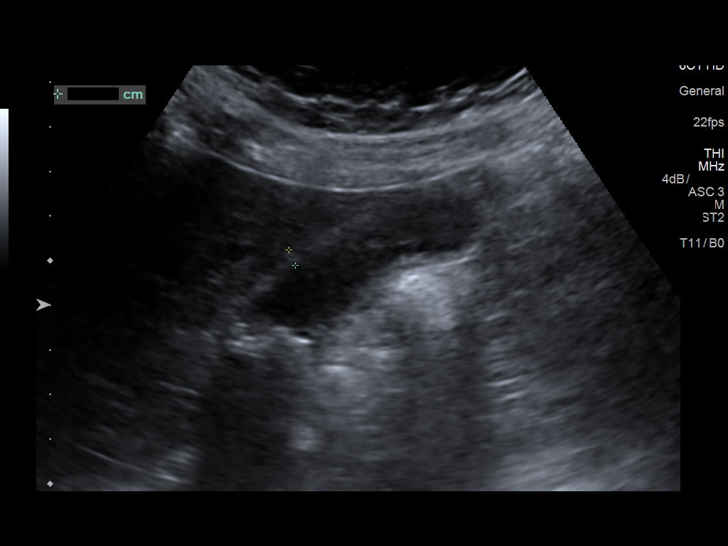
[im 4/43]
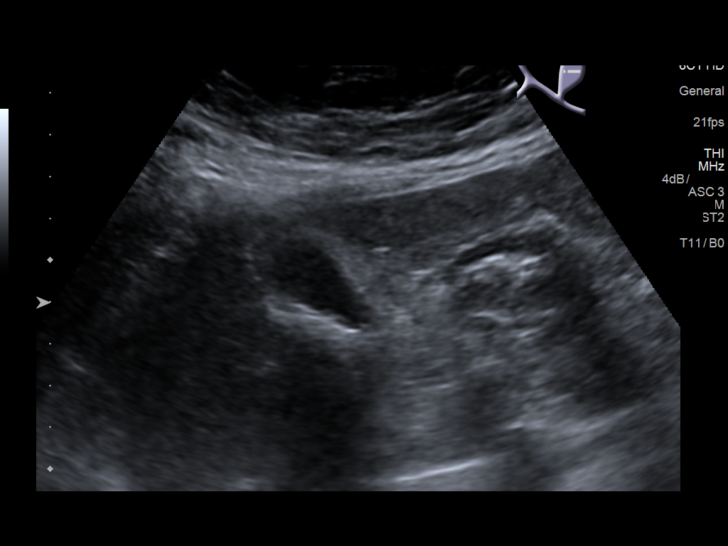
[im 8/43]
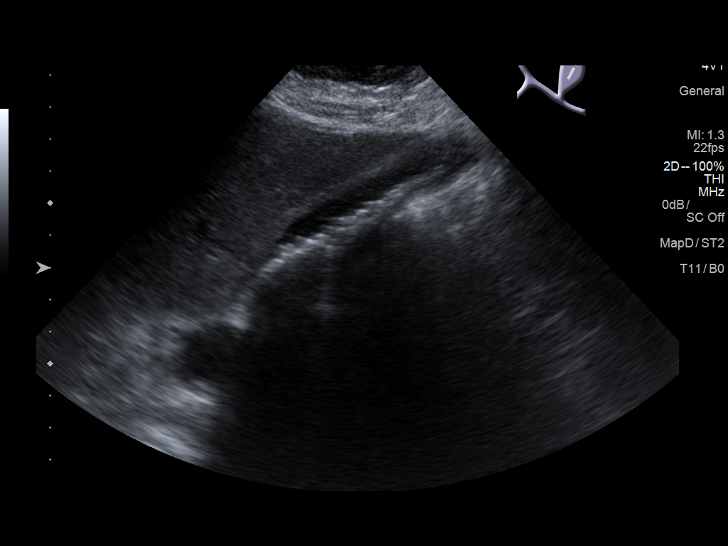
[im 11/43]
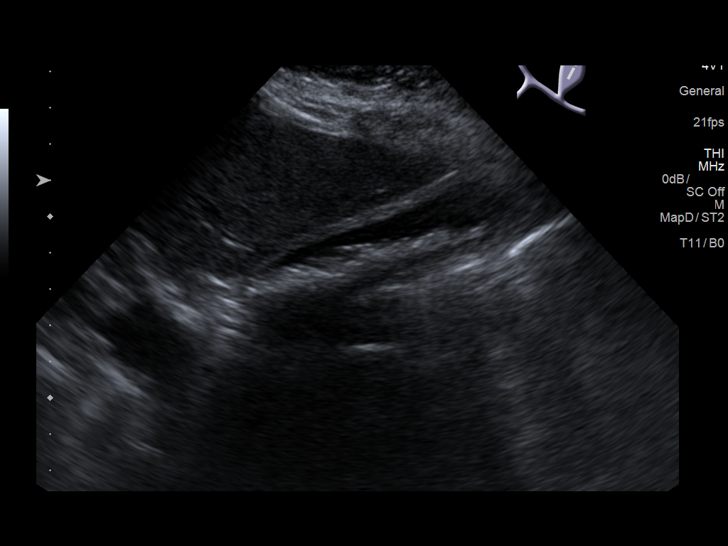
[im 15/43]
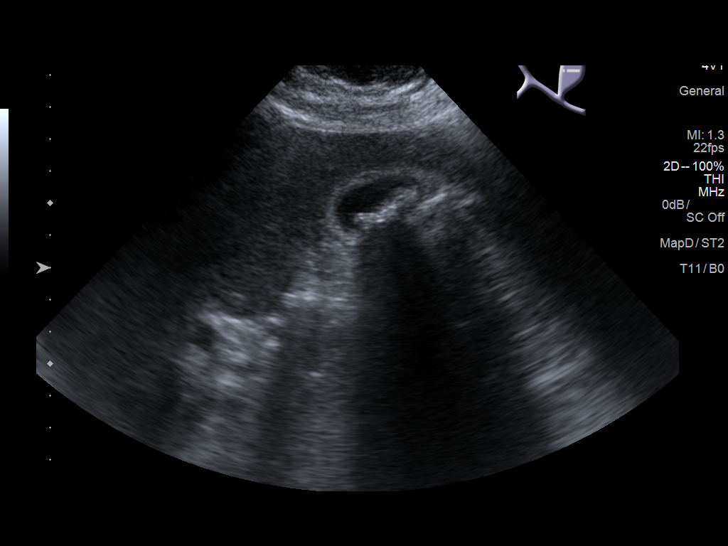
[im 16/43]
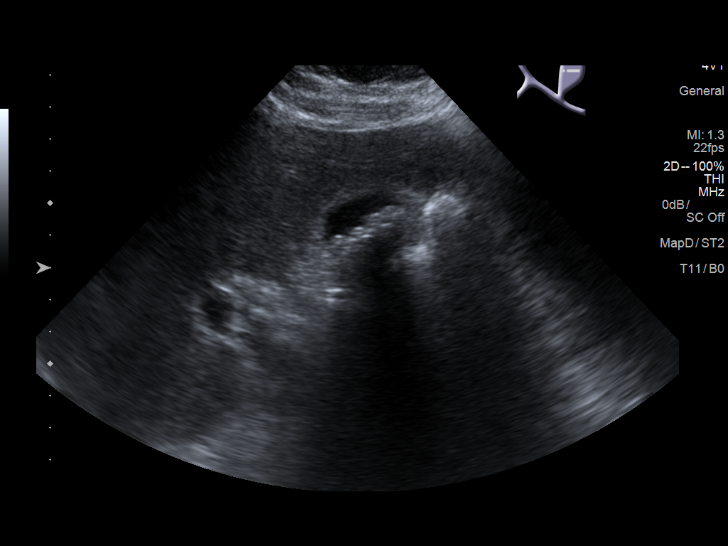
[im 20/43]
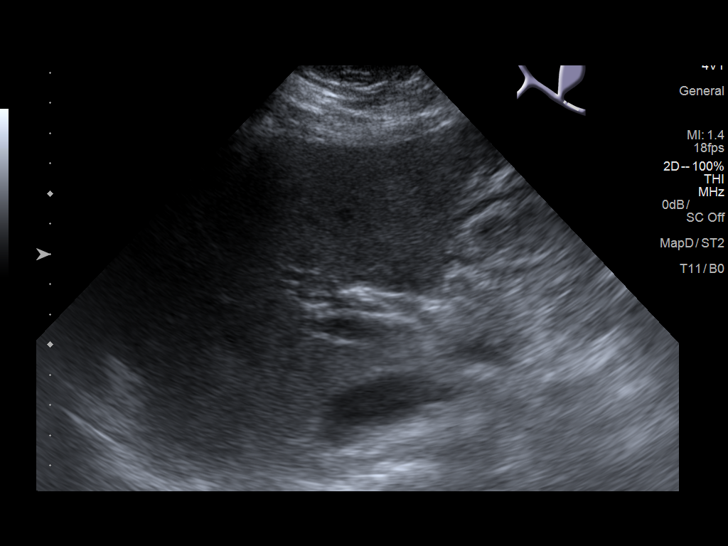
[im 23/43]
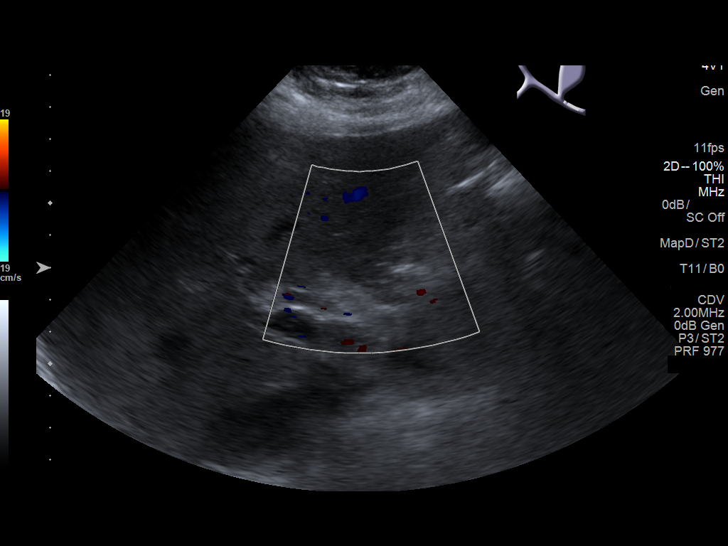
[im 27/43]
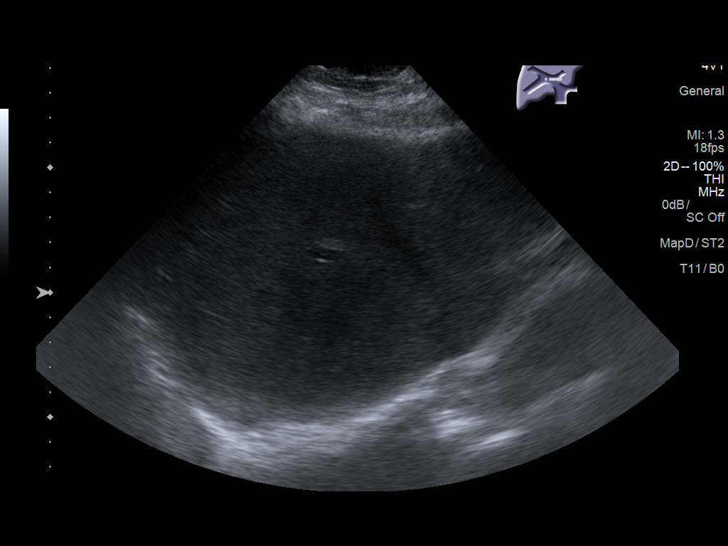
[im 29/43]
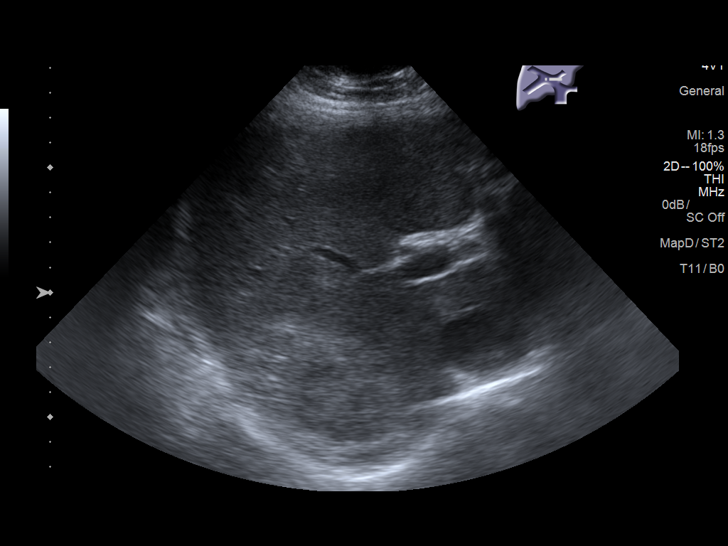
[im 32/43]
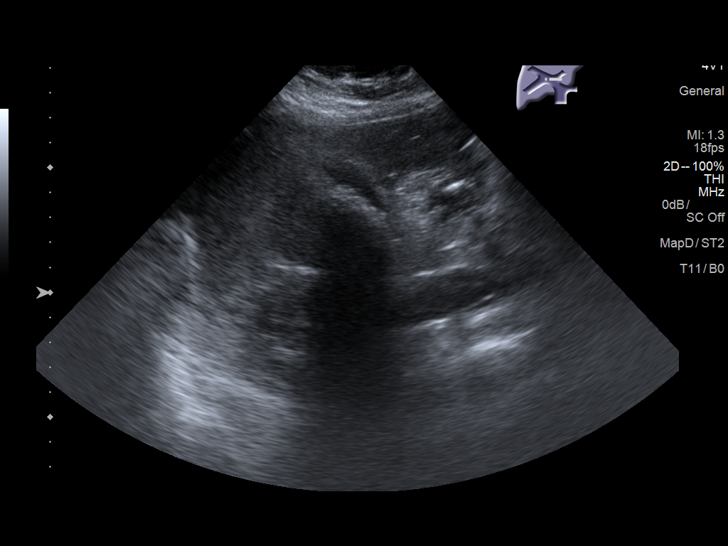
[im 36/43]
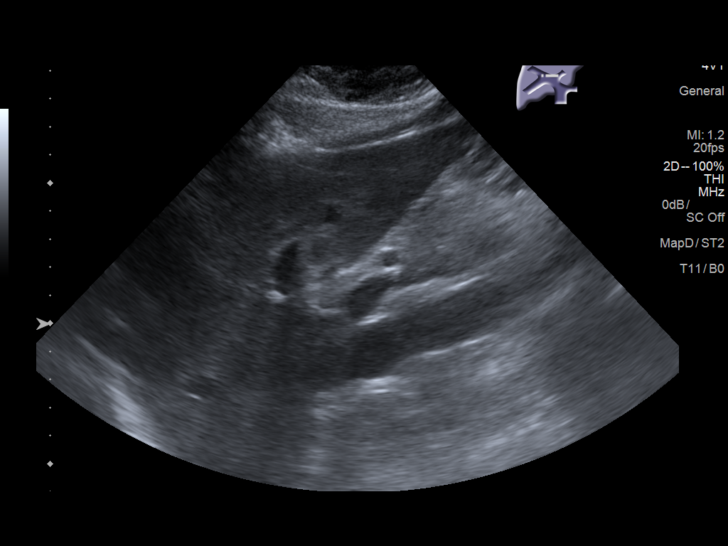
[im 39/43]
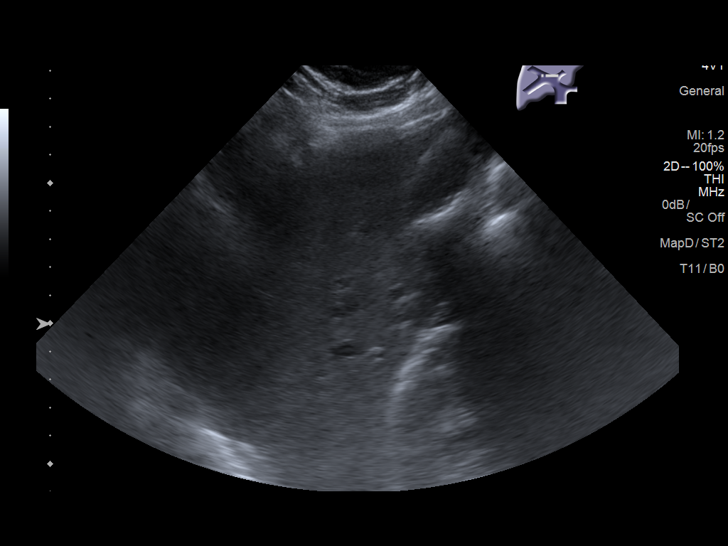
[im 43/43]
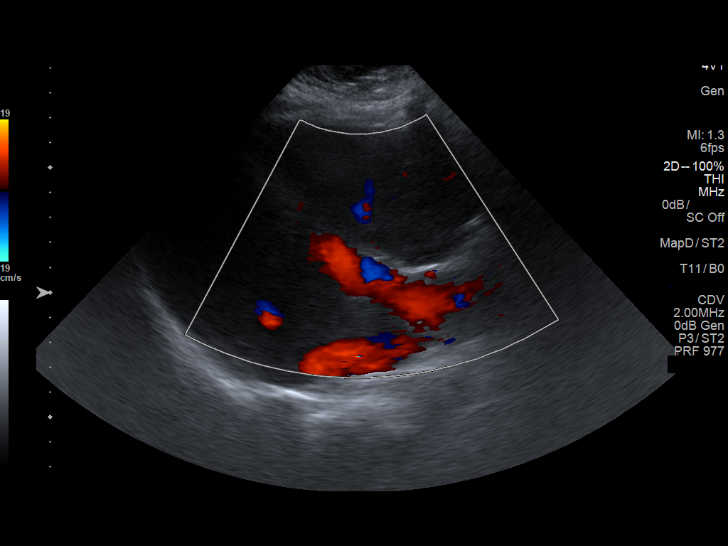

[14 of 25 positions shown; findings below may reference images not displayed]

FINDINGS: Gallbladder:

Within the gallbladder, there are multiple echogenic foci which move
and shadow consistent with cholelithiasis. Largest gallstone
measures 5 mm in length. The gallbladder wall appears thickened.
There is no appreciable pericholecystic fluid. No sonographic Murphy
sign noted by sonographer.

Common bile duct:

Diameter: 5 mm. No intrahepatic or extrahepatic biliary duct
dilatation.

Liver:

No focal lesion identified. Within normal limits in parenchymal
echogenicity. Portal vein is patent on color Doppler imaging with
normal direction of blood flow towards the liver.
IMPRESSION: Cholelithiasis with gallbladder wall thickening. There may be a
degree of acute cholecystitis. This finding may warrant correlation
with nuclear medicine hepatobiliary imaging study to assess for
cystic duct patency. Study otherwise unremarkable.

## 2019-03-13 IMAGING — DX DG CHEST 2V
2 series · 2 of 2 positions shown · non-contrast
Comparison: None.

CLINICAL DATA: Preop cholecystectomy

EXAM:
CHEST - 2 VIEW

[chest pa]
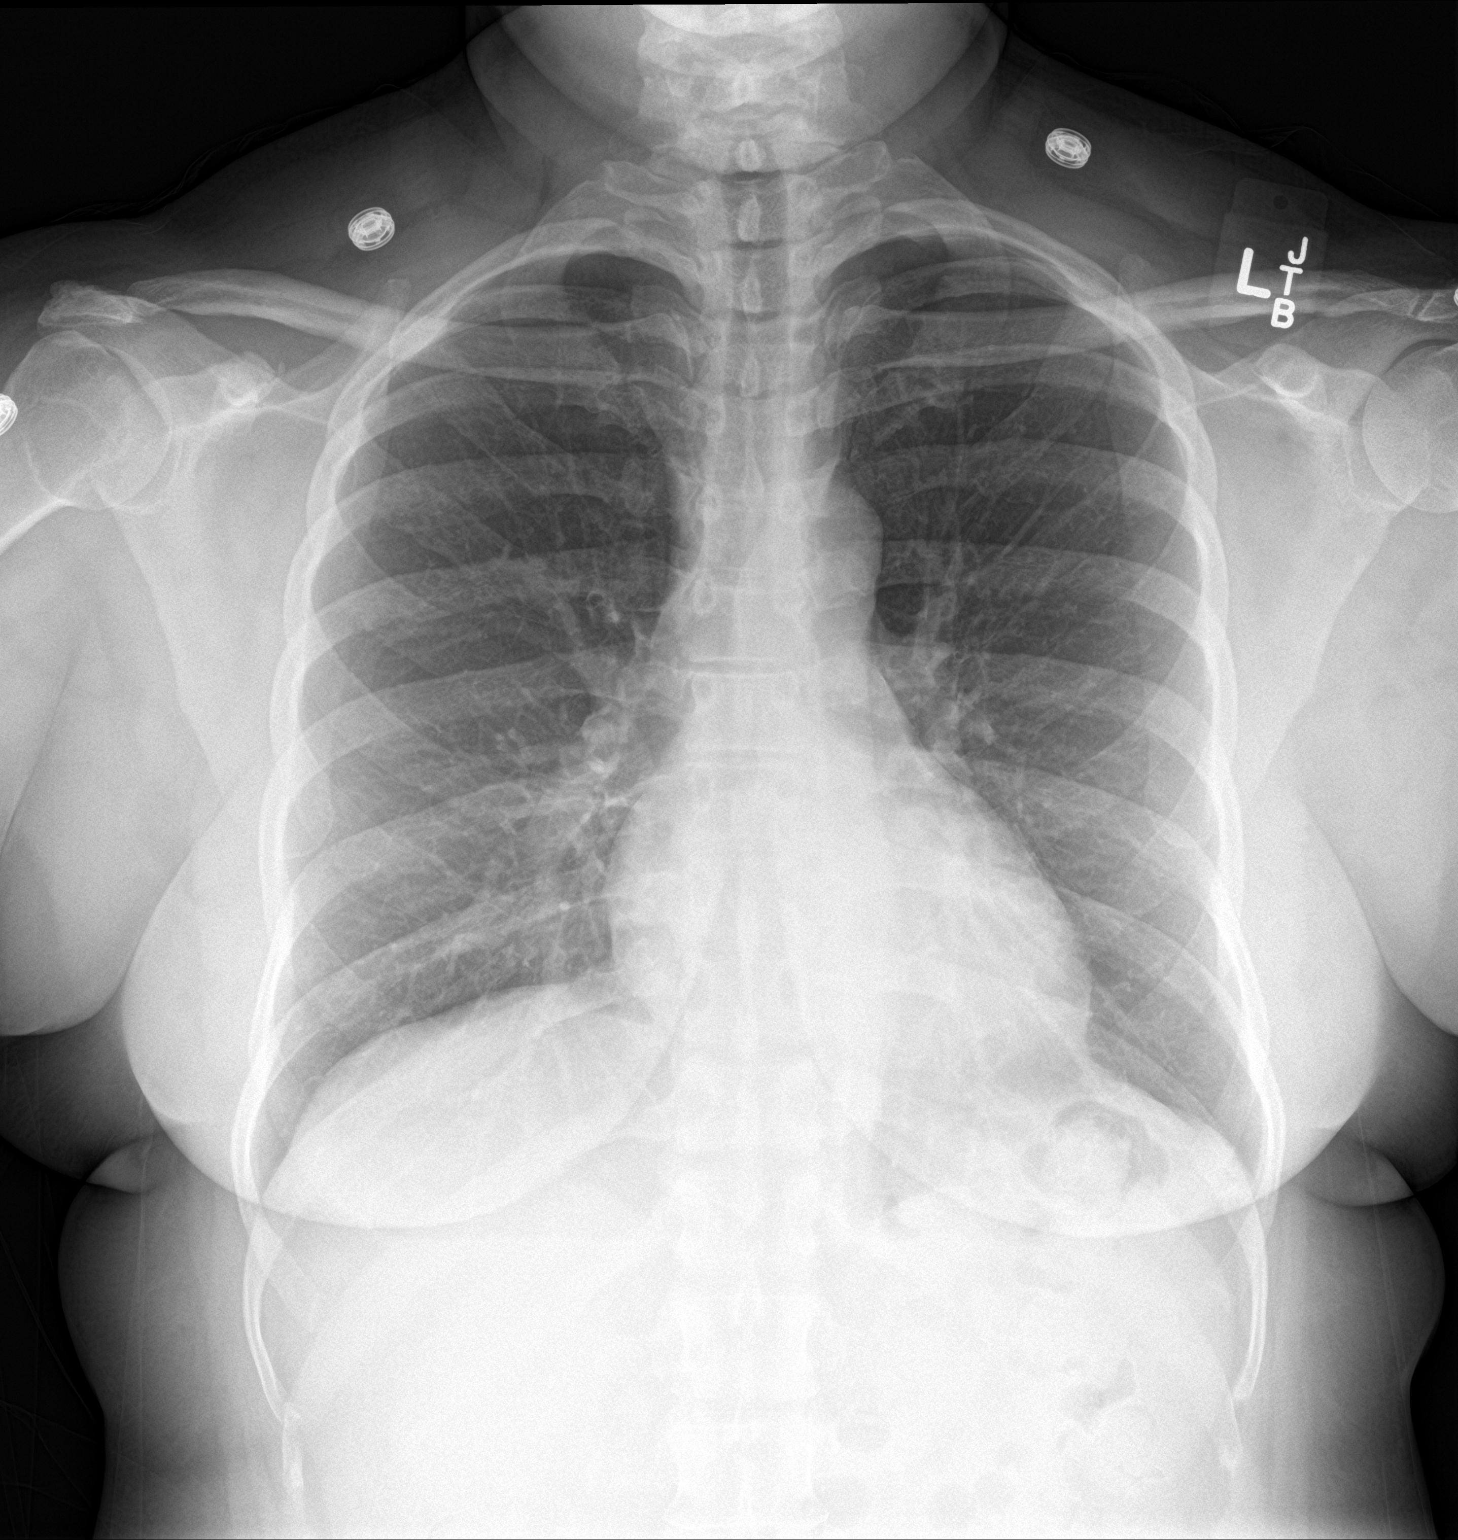

[chest lat]
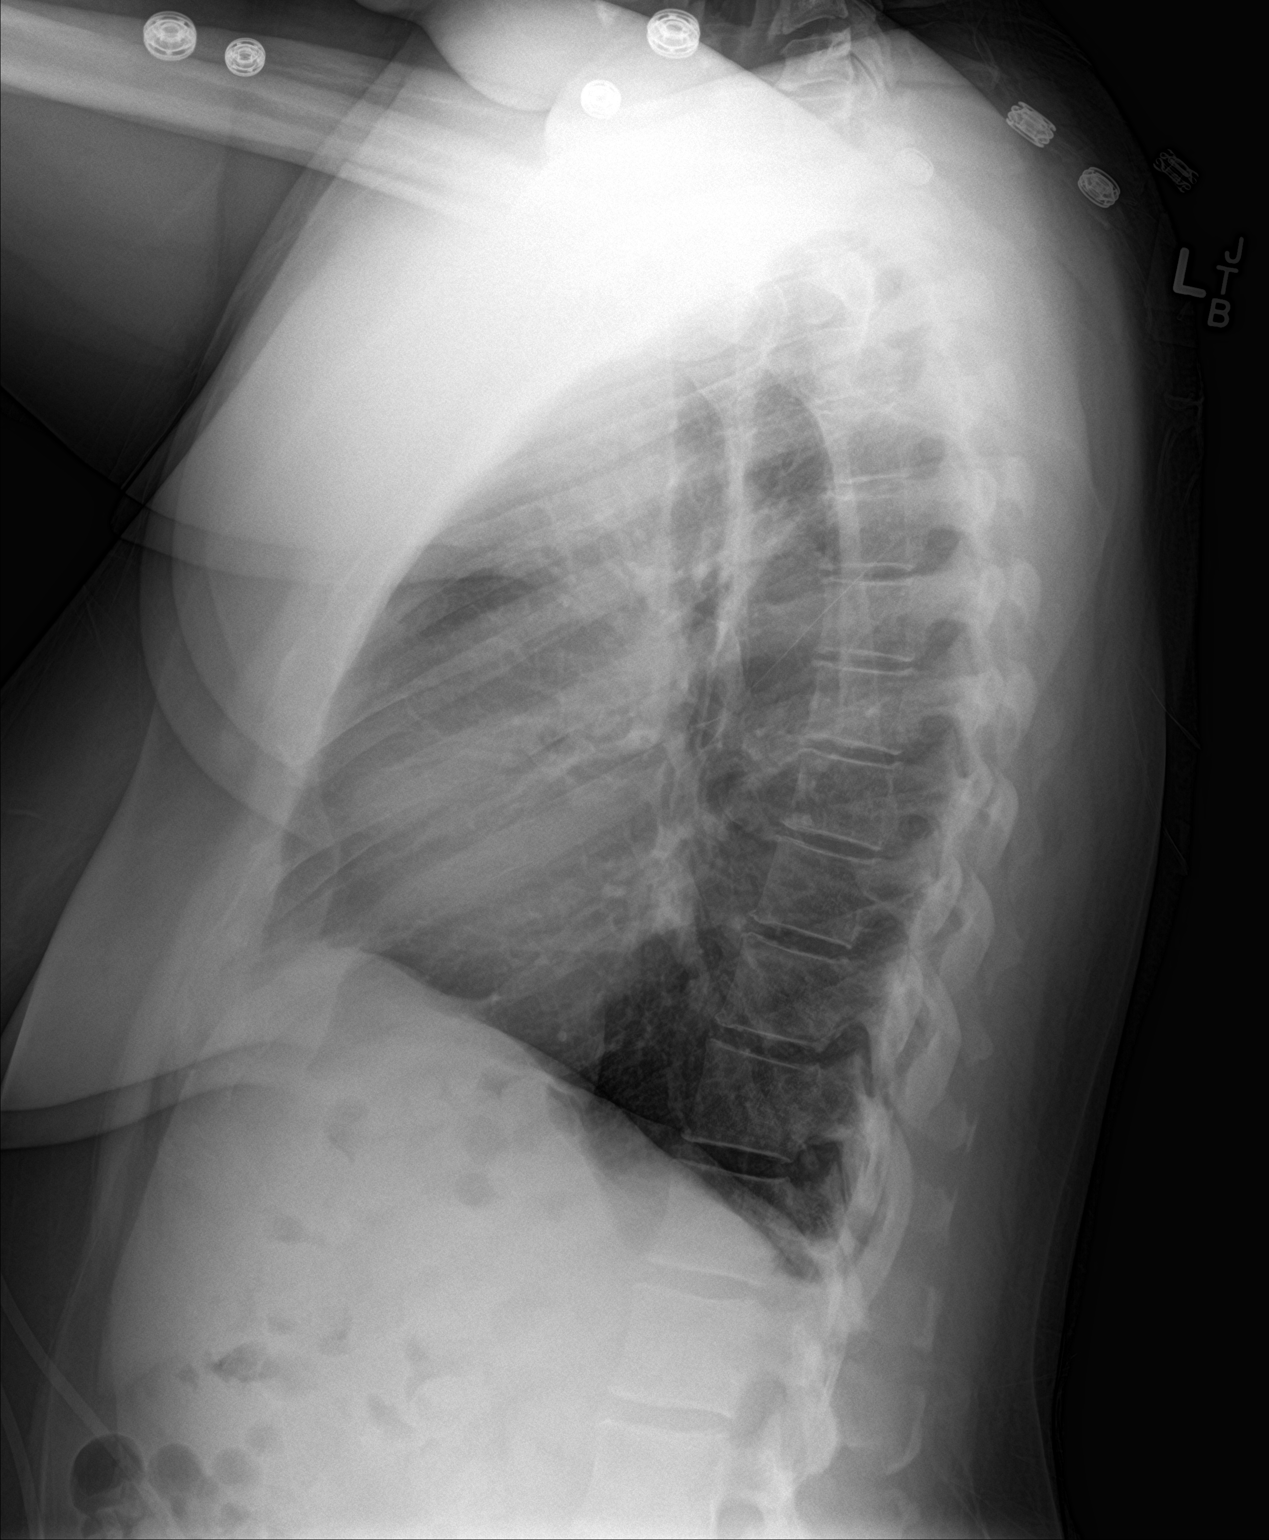

[2 of 2 positions shown; findings below may reference images not displayed]

FINDINGS: Left base atelectasis or scarring. Right lung clear. Heart is normal
size. No effusions or acute bony abnormality.
IMPRESSION: Left base atelectasis or scarring.

## 2019-03-14 IMAGING — RF DG CHOLANGIOGRAM OPERATIVE
1 series · 4 of 4 positions shown · non-contrast
Comparison: None.

CLINICAL DATA: Cholecystectomy for gallstones.

EXAM:
INTRAOPERATIVE CHOLANGIOGRAM
TECHNIQUE: Cholangiographic images from the C-arm fluoroscopic device were
submitted for interpretation post-operatively. Please see the
procedural report for the amount of contrast and the fluoroscopy
time utilized.

[Series 1: run · 4 of 45 frames shown]
[frame 7/45]
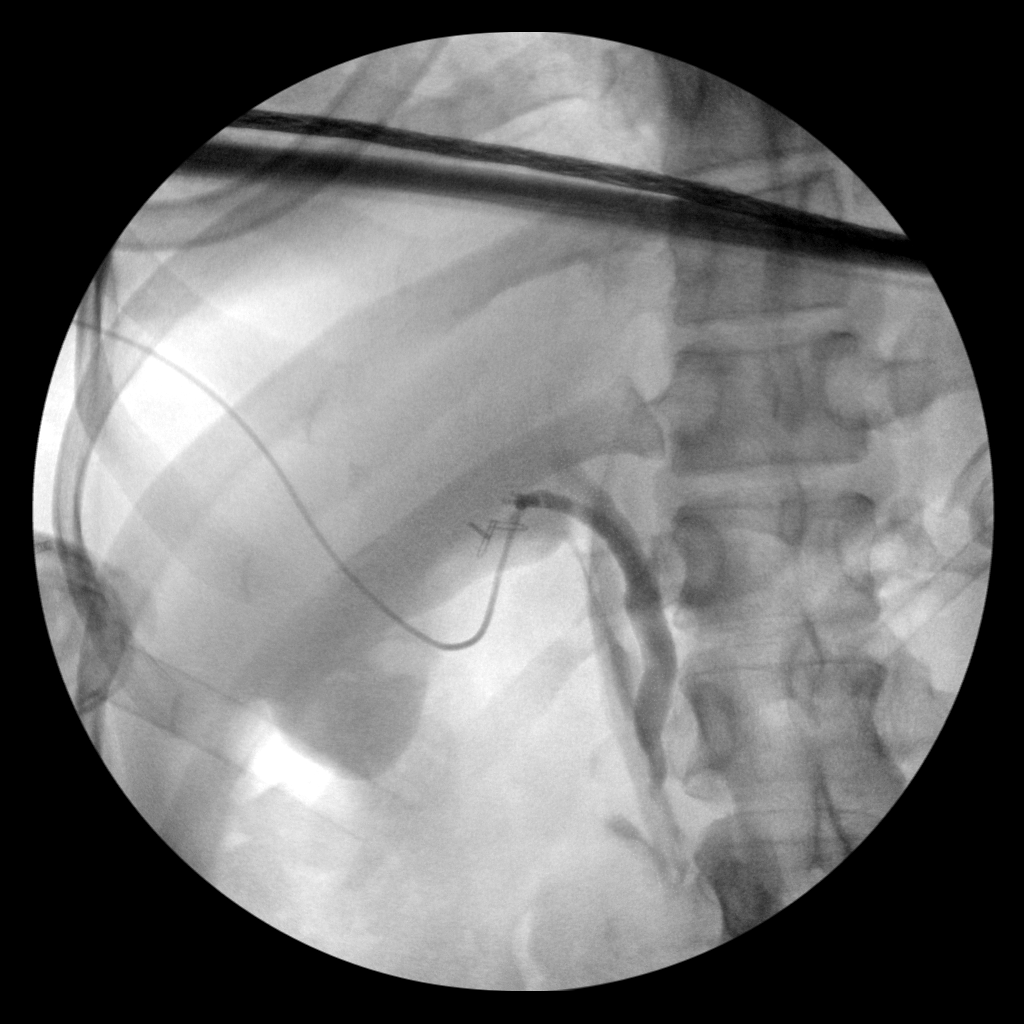
[frame 23/45]
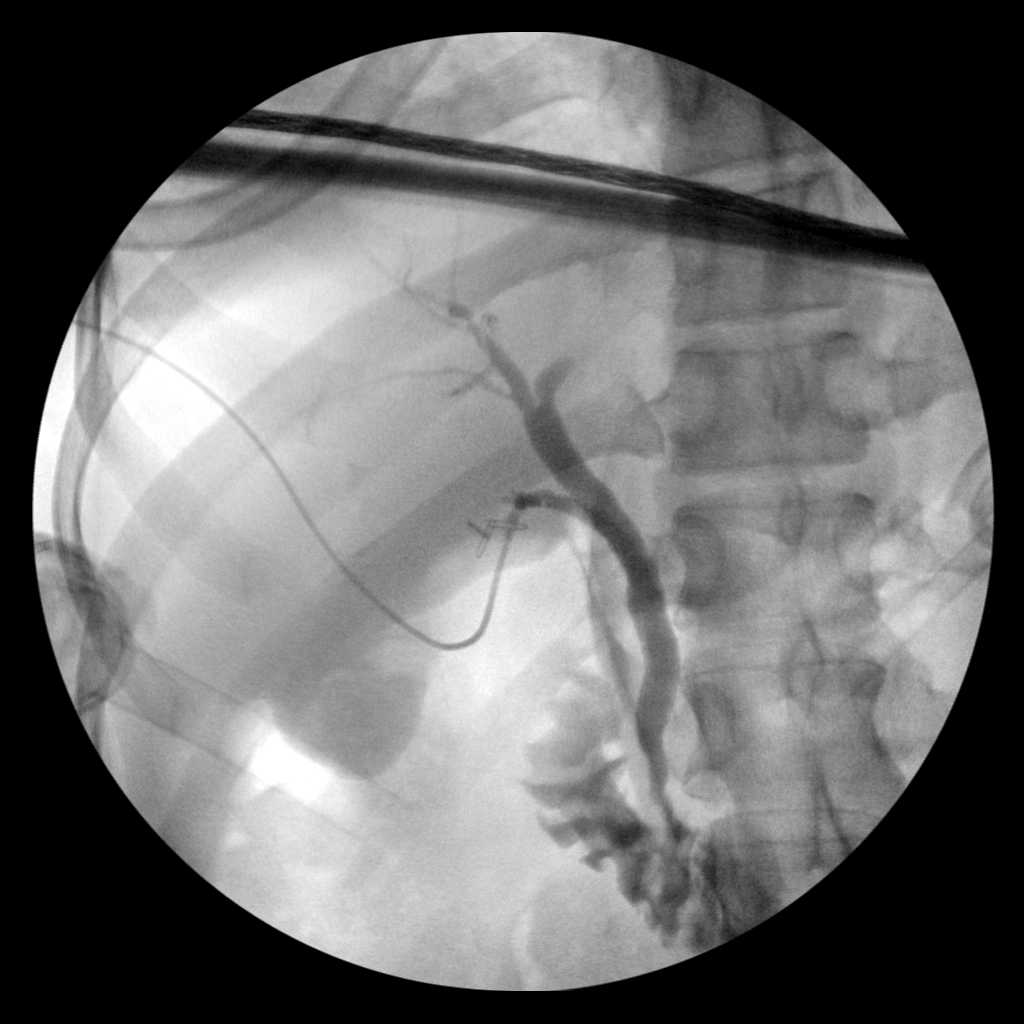
[frame 39/45]
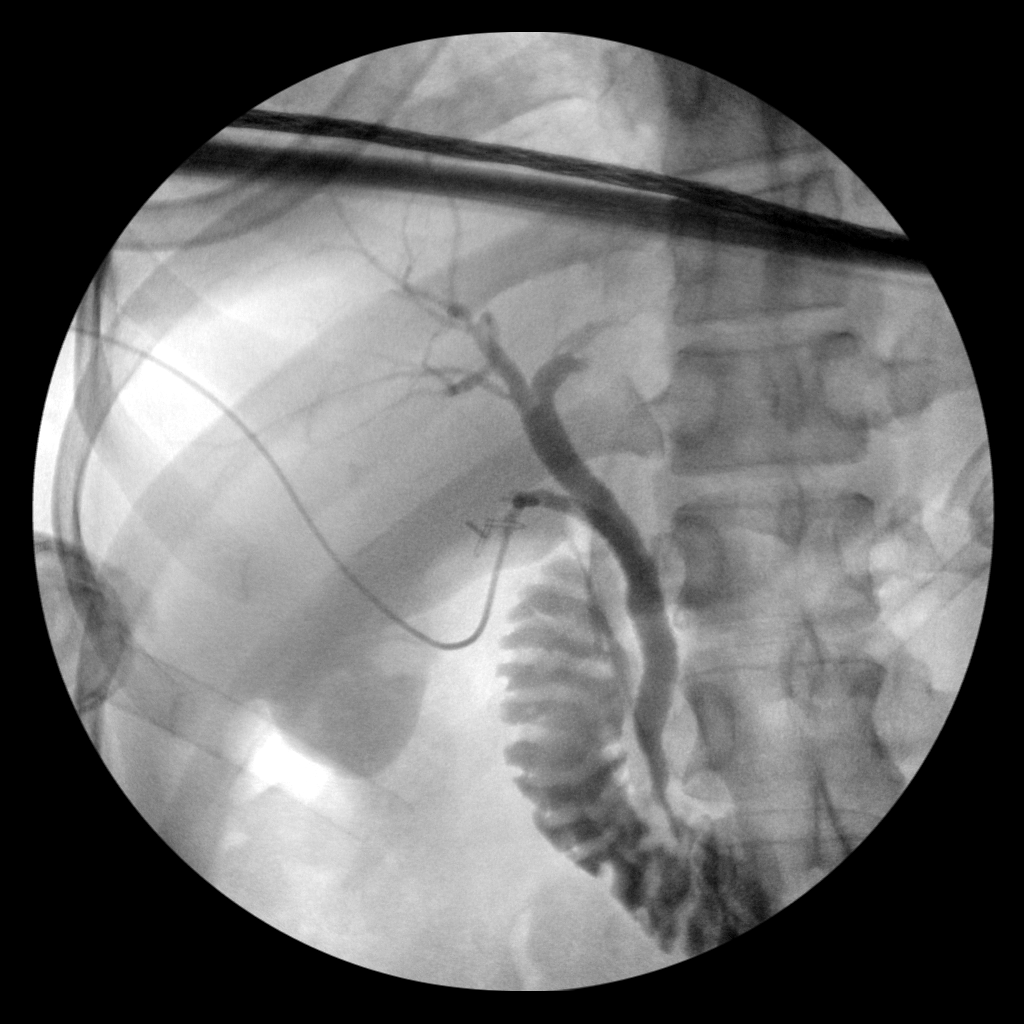
[frame 45/45]
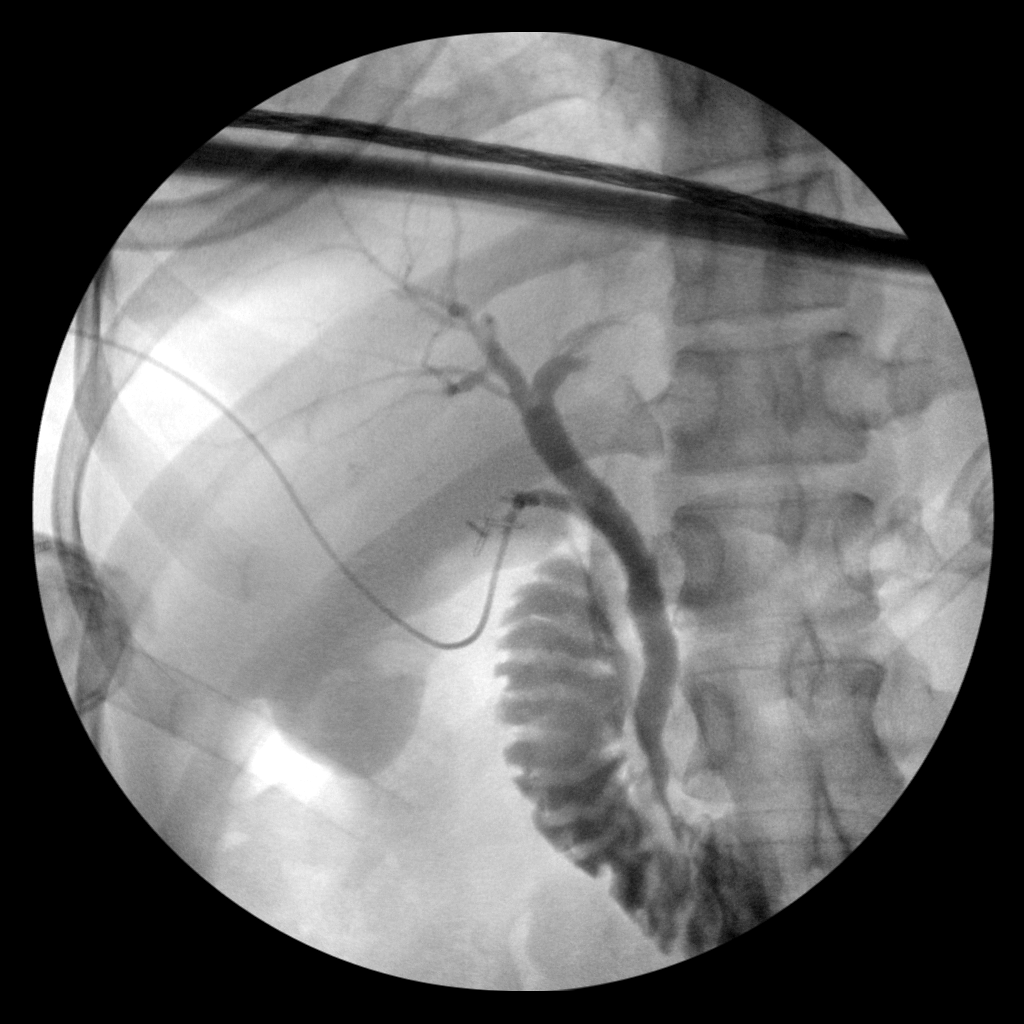

[4 of 4 positions shown; findings below may reference images not displayed]

FINDINGS: Contrast fills the biliary tree and duodenum without filling defects
in the common bile duct.
IMPRESSION: Patent biliary tree.

## 2019-03-26 ENCOUNTER — Other Ambulatory Visit: Payer: Self-pay

## 2019-03-26 ENCOUNTER — Emergency Department
Admission: EM | Admit: 2019-03-26 | Discharge: 2019-03-26 | Disposition: A | Discharge status: Home / Self Care | Payer: Self-pay | Attending: Emergency Medicine | Admitting: Emergency Medicine

## 2019-03-26 ENCOUNTER — Encounter: Payer: Self-pay | Admitting: Emergency Medicine

## 2019-03-26 DIAGNOSIS — Z79899 Other long term (current) drug therapy: Secondary | ICD-10-CM | POA: Insufficient documentation

## 2019-03-26 DIAGNOSIS — M5441 Lumbago with sciatica, right side: Secondary | ICD-10-CM | POA: Insufficient documentation

## 2019-03-26 DIAGNOSIS — I10 Essential (primary) hypertension: Secondary | ICD-10-CM | POA: Insufficient documentation

## 2019-03-26 LAB — URINALYSIS, COMPLETE (UACMP) WITH MICROSCOPIC
Bacteria, UA: NONE SEEN
Bilirubin Urine: NEGATIVE
Glucose, UA: NEGATIVE mg/dL
Hgb urine dipstick: NEGATIVE
Ketones, ur: NEGATIVE mg/dL
Leukocytes,Ua: NEGATIVE
Nitrite: NEGATIVE
Protein, ur: 30 mg/dL — AB
Specific Gravity, Urine: 1.027 (ref 1.005–1.030)
pH: 6 (ref 5.0–8.0)

## 2019-03-26 MED ORDER — METHOCARBAMOL 500 MG PO TABS: 500.0000 mg | ORAL_TABLET | Freq: Three times a day (TID) | ORAL | 0 refills | Status: AC | PRN

## 2019-03-26 MED ORDER — KETOROLAC TROMETHAMINE 30 MG/ML IJ SOLN
30.0000 mg | Freq: Once | INTRAMUSCULAR | Status: AC
  Administered 2019-03-26: 30 mg via INTRAMUSCULAR
  Filled 2019-03-26: qty 1

## 2019-03-26 MED ORDER — KETOROLAC TROMETHAMINE 10 MG PO TABS: 10.0000 mg | ORAL_TABLET | Freq: Four times a day (QID) | ORAL | 0 refills | Status: AC | PRN

## 2019-03-26 NOTE — ED Provider Notes (Signed)
[facility] Emergency Department Provider Note  ____________________________________________  Time seen: Approximately 3:15 PM  I have reviewed the triage vital signs and the nursing notes.   HISTORY  Chief Complaint Back Pain    HPI Savannah Bond is a 53 y.o. female with a history of hypertension, presents to the emergency department with 8 out of 10 right-sided low back pain that radiates into the posterior aspect of the right leg and right buttocks.  Patient states that pain started after she slept on her couch.  She denies difficulty with ambulation but states that pain does worsen with movement.  She denies bowel or bladder incontinence or saddle anesthesia.  She states that she has been taking ibuprofen at home.  No chest pain, chest tightness or shortness of breath.  No other alleviating measures have been attempted.        Past Medical History:  Diagnosis Date  . Cholecystitis 08/2017  . Hypertension     Patient Active Problem List   Diagnosis Date Noted  . Acute cholecystitis 09/23/2017    Past Surgical History:  Procedure Laterality Date  . ABDOMINAL HYSTERECTOMY     PARTIAL  . CHOLECYSTECTOMY N/A 09/25/2017   Procedure: LAPAROSCOPIC CHOLECYSTECTOMY WITH INTRAOPERATIVE CHOLANGIOGRAM;  Surgeon: Berna Bueonnor, Chelsea A, MD;  Location: [facility] OR;  Service: General;  Laterality: N/A;  . TUBAL LIGATION      Prior to Admission medications   Medication Sig Start Date End Date Taking? Authorizing Provider  hydrochlorothiazide (HYDRODIURIL) 25 MG tablet Take 25 mg by mouth daily.    [provider]  HYDROcodone-acetaminophen (NORCO) 5-325 MG tablet Take 1-2 tablets by mouth every 6 (six) hours as needed for moderate pain or severe pain. 09/26/17   Claud KelpIngram, [place], MD  ketorolac (TORADOL) 10 MG tablet Take 1 tablet (10 mg total) by mouth every 6 (six) hours as needed for up to 5 days. 03/26/19 03/31/19  Orvil FeilWoods, Teran Daughenbaugh M, PA-C  lisinopril  (PRINIVIL,ZESTRIL) 40 MG tablet Take 40 mg by mouth daily.    [provider]  methocarbamol (ROBAXIN) 500 MG tablet Take 1 tablet (500 mg total) by mouth every 8 (eight) hours as needed for up to 5 days. 03/26/19 03/31/19  Orvil FeilWoods, Jyles Sontag M, PA-C    Allergies Patient has no known allergies.  No family history on file.  Social History Social History   Tobacco Use  . Smoking status: Never Smoker  . Smokeless tobacco: Never Used  Substance Use Topics  . Alcohol use: Never    Frequency: Never    Comment: OCCASIONAL  . Drug use: Never     Review of Systems  Constitutional: No fever/chills Eyes: No visual changes. No discharge ENT: No upper respiratory complaints. Cardiovascular: no chest pain. Respiratory: no cough. No SOB. Gastrointestinal: No abdominal pain.  No nausea, no vomiting.  No diarrhea.  No constipation. Genitourinary: Negative for dysuria. No hematuria Musculoskeletal: Patient has low back pain.  Skin: Negative for rash, abrasions, lacerations, ecchymosis. Neurological: Negative for headaches, focal weakness or numbness.   ____________________________________________   PHYSICAL EXAM:  VITAL SIGNS: ED Triage Vitals [03/26/19 1424]  Enc Vitals Group     BP (!) 160/83     Pulse Rate 74     Resp 20     Temp 98.3 F (36.8 C)     Temp Source Oral     SpO2 97 %     Weight 192 lb (87.1 kg)     Height 5\' 1"  (1.549 m)  Head Circumference      Peak Flow      Pain Score 10     Pain Loc      Pain Edu?      Excl. in GC?      Constitutional: Alert and oriented. Well appearing and in no acute distress. Eyes: Conjunctivae are normal. PERRL. EOMI. Head: Atraumatic. Cardiovascular: Normal rate, regular rhythm. Normal S1 and S2.  Good peripheral circulation. Respiratory: Normal respiratory effort without tachypnea or retractions. Lungs CTAB. Good air entry to the bases with no decreased or absent breath sounds. Gastrointestinal: Bowel sounds 4  quadrants. Soft and nontender to palpation. No guarding or rigidity. No palpable masses. No distention. No CVA tenderness. Musculoskeletal: Full range of motion to all extremities. No gross deformities appreciated. Patient has right sided paraspinal muscle tenderness along the lumbar spine. Positive straight leg raise test, right.  Neurologic:  Normal speech and language. No gross focal neurologic deficits are appreciated.  Skin:  Skin is warm, dry and intact. No rash noted. Psychiatric: Mood and affect are normal. Speech and behavior are normal. Patient exhibits appropriate insight and judgement.   ____________________________________________   LABS (all labs ordered are listed, but only abnormal results are displayed)  Labs Reviewed  URINALYSIS, COMPLETE (UACMP) WITH MICROSCOPIC - Abnormal; Notable for the following components:      Result Value   Color, Urine YELLOW (*)    APPearance CLOUDY (*)    Protein, ur 30 (*)    All other components within normal limits   ____________________________________________  EKG   ____________________________________________  RADIOLOGY   No results found.  ____________________________________________    PROCEDURES  Procedure(s) performed:    Procedures    Medications  ketorolac (TORADOL) 30 MG/ML injection 30 mg (30 mg Intramuscular Given 03/26/19 1523)     ____________________________________________   INITIAL IMPRESSION / ASSESSMENT AND PLAN / ED COURSE  Pertinent labs & imaging results that were available during my care of the patient were reviewed by me and considered in my medical decision making (see chart for details).  Review of the [place] CSRS was performed in accordance of the [facility] prior to dispensing any controlled drugs.         Assessment and Plan: Low Back Pain: 53 year old female presents to the emergency department with radiating right-sided lower back pain for the past 4 days.  Patient was hypertensive  at triage but vital signs were otherwise reassuring.  She had right-sided paraspinal muscle tenderness to palpation and a positive straight leg raise test on the right.  Overall, patient appeared comfortable in exam room and was resting on her side.  She appeared in no distress.  Differential diagnosis includes cystitis, muscle spasm, lumbar strain, sciatica...  We will obtain urinalysis and administer Toradol and will reassess....  Patient reports that low back pain resolved with Toradol.  Urinalysis was noncontributory for cystitis.  Patient was discharged with p.o. Toradol and with Robaxin.  Cautioned patient not to drive after taking Robaxin.  She voiced understanding.  All patient questions were answered.   ____________________________________________  FINAL CLINICAL IMPRESSION(S) / ED DIAGNOSES  Final diagnoses:  Acute bilateral low back pain with right-sided sciatica      NEW MEDICATIONS STARTED DURING THIS VISIT:  ED Discharge Orders         Ordered    ketorolac (TORADOL) 10 MG tablet  Every 6 hours PRN     03/26/19 1558    methocarbamol (ROBAXIN) 500 MG tablet  Every 8 hours PRN  03/26/19 1558              This chart was dictated using voice recognition software/Dragon. Despite best efforts to proofread, errors can occur which can change the meaning. Any change was purely unintentional.    Lannie Fields, PA-C 03/26/19 1602    Earleen Newport, MD 03/26/19 1630

## 2019-03-26 NOTE — ED Notes (Signed)
Pt ambulated to bathroom unassisted with slow but steady gait. Pt states she is ambulating better than when she was at home

## 2019-03-26 NOTE — ED Triage Notes (Signed)
Low back pain since slept on couch 4 days ago. Increases with movement.

## 2019-03-26 NOTE — ED Notes (Signed)
First Nurse Note: Pt to ED via [facility] from home for back pain. Per EMS pt c/o a lot of pressure. Pt has hx/o HTN and non-compliance with medication.

## 2019-03-26 NOTE — Discharge Instructions (Signed)
Take Toradol and Robaxin at home for low back pain. Toradol can be taken up to 4 times daily.  Robaxin should be taken before bed. Please do not operate heavy machinery while taking Robaxin.

## 2020-04-11 ENCOUNTER — Telehealth (HOSPITAL_COMMUNITY): Payer: Self-pay

## 2020-04-11 NOTE — Telephone Encounter (Signed)
Called to Discuss with patient about Covid symptoms and the use of the monoclonal antibody infusion for those with mild to moderate Covid symptoms and at a high risk of hospitalization.     Pt appears to qualify for this infusion due to co-morbid conditions and/or a member of an at-risk group in accordance with the FDA Emergency Use Authorization.    Risk factors include hypertension. Sx onset 04/04/20. Positive test on 04/05/2020 at [facility] in [place], [place].  Pre-screened by RN and ready for APP to call and further discuss and/or schedule infusion appt.

## 2020-04-12 ENCOUNTER — Other Ambulatory Visit: Payer: Self-pay | Admitting: Nurse Practitioner

## 2020-04-12 ENCOUNTER — Encounter: Payer: Self-pay | Admitting: Nurse Practitioner

## 2020-04-12 ENCOUNTER — Ambulatory Visit (HOSPITAL_COMMUNITY)
Admission: RE | Admit: 2020-04-12 | Discharge: 2020-04-12 | Disposition: A | Discharge status: Home / Self Care | Payer: HRSA Program | Source: Ambulatory Visit | Attending: Pulmonary Disease | Admitting: Pulmonary Disease

## 2020-04-12 DIAGNOSIS — U071 COVID-19: Secondary | ICD-10-CM

## 2020-04-12 MED ORDER — SOTROVIMAB 500 MG/8ML IV SOLN
500.0000 mg | Freq: Once | INTRAVENOUS | Status: AC
  Administered 2020-04-12: 500 mg via INTRAVENOUS

## 2020-04-12 MED ORDER — SODIUM CHLORIDE 0.9 % IV BOLUS
1000.0000 mL | Freq: Once | INTRAVENOUS | Status: AC
  Administered 2020-04-12: 1000 mL via INTRAVENOUS

## 2020-04-12 MED ORDER — ACETAMINOPHEN 325 MG PO TABS
650.0000 mg | ORAL_TABLET | Freq: Four times a day (QID) | ORAL | Status: DC | PRN
  Administered 2020-04-12: 650 mg via ORAL
  Filled 2020-04-12: qty 2

## 2020-04-12 MED ORDER — FAMOTIDINE IN NACL 20-0.9 MG/50ML-% IV SOLN: 20.0000 mg | Freq: Once | INTRAVENOUS | Status: DC | PRN

## 2020-04-12 MED ORDER — SODIUM CHLORIDE 0.9 % IV SOLN: INTRAVENOUS | Status: DC | PRN

## 2020-04-12 MED ORDER — ALBUTEROL SULFATE HFA 108 (90 BASE) MCG/ACT IN AERS: 2.0000 | INHALATION_SPRAY | Freq: Once | RESPIRATORY_TRACT | Status: DC | PRN

## 2020-04-12 MED ORDER — DIPHENHYDRAMINE HCL 50 MG/ML IJ SOLN: 50.0000 mg | Freq: Once | INTRAMUSCULAR | Status: DC | PRN

## 2020-04-12 MED ORDER — METHYLPREDNISOLONE SODIUM SUCC 125 MG IJ SOLR: 125.0000 mg | Freq: Once | INTRAMUSCULAR | Status: DC | PRN

## 2020-04-12 MED ORDER — EPINEPHRINE 0.3 MG/0.3ML IJ SOAJ: 0.3000 mg | Freq: Once | INTRAMUSCULAR | Status: DC | PRN

## 2020-04-12 NOTE — Progress Notes (Signed)
  Diagnosis: COVID-19  Physician: Dr. Shan Levans  Procedure: Sotrovimab. Pt given medication fact sheet prior to infusion.  Complications: No immediate complications noted.  Discharge: Discharged home   Pt had fever upon discharge, wants to take ibuprofen at home. HR improved after 500cc NS and pt wishes to go home at this time. Able to drink without emesis.  Cherlynn Perches 04/12/2020

## 2020-04-12 NOTE — Progress Notes (Signed)
I connected by phone with Tora Duck on 04/12/2020 at 10:00 AM to discuss the potential use of a treatment for mild to moderate COVID-19 viral infection in non-hospitalized patients.  This patient is a 54 y.o. female that meets the FDA criteria for Emergency Use Authorization of bamlanivimab/etesevimab, casirivimab\imdevimab, or sotrovimab  Has a (+) direct SARS-CoV-2 viral test result  Has mild or moderate COVID-19   Is ? 53 years of age and weighs ? 40 kg  Is NOT hospitalized due to COVID-19  Is NOT requiring oxygen therapy or requiring an increase in baseline oxygen flow rate due to COVID-19  Is within 10 days of symptom onset  Has at least one of the high risk factor(s) for progression to severe COVID-19 and/or hospitalization as defined in EUA.  Specific high risk criteria : Cardiovascular disease or hypertension   I have spoken and communicated the following to the patient or parent/caregiver:  1. FDA has authorized the emergency use of bamlanivimab/etesevimab, casirivimab\imdevimab, or sotrovimab for the treatment of mild to moderate COVID-19 in adults and pediatric patients with positive results of direct SARS-CoV-2 viral testing who are 9 years of age and older weighing at least 40 kg, and who are at high risk for progressing to severe COVID-19 and/or hospitalization.  2. The significant known and potential risks and benefits of bamlanivimab/etesevimab, casirivimab\imdevimab, or sotrovimab, and the extent to which such potential risks and benefits are unknown.  3. Information on available alternative treatments and the risks and benefits of those alternatives, including clinical trials.  4. Patients treated with bamlanivimab/etesevimab, casirivimab\imdevimab, or sotrovimab should continue to self-isolate and use infection control measures (e.g., wear mask, isolate, social distance, avoid sharing personal items, clean and disinfect "high touch" surfaces, and frequent  handwashing) according to CDC guidelines.   5. The patient or parent/caregiver has the option to accept or refuse bamlanivimab/etesevimab, casirivimab\imdevimab, or sotrovimab.  After reviewing this information with the patient, the patient has agreed to receive one of the available covid 19 monoclonal antibodies and will be provided an appropriate fact sheet prior to infusion.Consuello Masse, DNP, AGNP-C [phone] (Infusion Center Hotline)

## 2020-04-12 NOTE — Discharge Instructions (Signed)

## 2021-02-16 ENCOUNTER — Ambulatory Visit
Admission: EM | Admit: 2021-02-16 | Discharge: 2021-02-16 | Disposition: A | Discharge status: Home / Self Care | Payer: 59 | Attending: Medical Oncology | Admitting: Medical Oncology

## 2021-02-16 ENCOUNTER — Other Ambulatory Visit: Payer: Self-pay

## 2021-02-16 DIAGNOSIS — M545 Low back pain, unspecified: Secondary | ICD-10-CM

## 2021-02-16 DIAGNOSIS — M6289 Other specified disorders of muscle: Secondary | ICD-10-CM

## 2021-02-16 MED ORDER — METHOCARBAMOL 500 MG PO TABS: 500.0000 mg | ORAL_TABLET | Freq: Two times a day (BID) | ORAL | 0 refills | Status: DC

## 2021-02-16 MED ORDER — LISINOPRIL 20 MG PO TABS: 40.0000 mg | ORAL_TABLET | Freq: Every day | ORAL | 0 refills | Status: DC

## 2021-02-16 MED ORDER — LISINOPRIL 20 MG PO TABS: 20.0000 mg | ORAL_TABLET | Freq: Every day | ORAL | 0 refills | Status: DC

## 2021-02-16 NOTE — ED Triage Notes (Signed)
Pt here C/O lower back pain off and on for a couple years. This episode has been going on for 5 days.

## 2021-02-16 NOTE — ED Provider Notes (Signed)
[facility]    CSN: [id number] Arrival date & time: 02/16/21  1141      History   Chief Complaint Chief Complaint  Patient presents with   Back Pain    HPI Savannah Bond is a 55 y.o. female.   HPI  Back Pain: Patient reports that they have a history of low back pain flare. This been present for the past few years.  Current flare has been going on for about 5 days. No known injury, incontinence, leg weakness . She has been taking tylenol and using ice which have helped some. Of note she reports HTN of which she is followed by nephrology but needs a new PCP.    Past Medical History:  Diagnosis Date   Cholecystitis 08/2017   Hypertension     Patient Active Problem List   Diagnosis Date Noted   Acute cholecystitis 09/23/2017    Past Surgical History:  Procedure Laterality Date   ABDOMINAL HYSTERECTOMY     PARTIAL   CHOLECYSTECTOMY N/A 09/25/2017   Procedure: LAPAROSCOPIC CHOLECYSTECTOMY WITH INTRAOPERATIVE CHOLANGIOGRAM;  Surgeon: Berna Bue, MD;  Location: [facility] OR;  Service: General;  Laterality: N/A;   TUBAL LIGATION      OB History   No obstetric history on file.      Home Medications    Prior to Admission medications   Medication Sig Start Date End Date Taking? Authorizing Provider  hydrochlorothiazide (HYDRODIURIL) 25 MG tablet Take 25 mg by mouth daily.   Yes [provider]  lisinopril (PRINIVIL,ZESTRIL) 40 MG tablet Take 40 mg by mouth daily.   Yes [provider]  HYDROcodone-acetaminophen (NORCO) 5-325 MG tablet Take 1-2 tablets by mouth every 6 (six) hours as needed for moderate pain or severe pain. 09/26/17   Claud Kelp, MD    Family History History reviewed. No pertinent family history.  Social History Social History   Tobacco Use   Smoking status: Never   Smokeless tobacco: Never  Vaping Use   Vaping Use: Never used  Substance Use Topics   Alcohol use: Never    Comment: OCCASIONAL   Drug use:  Never     Allergies   Patient has no known allergies.   Review of Systems Review of Systems  As stated above in HPI Physical Exam Triage Vital Signs ED Triage Vitals  Enc Vitals Group     BP 02/16/21 1200 (!) 170/99     Pulse Rate 02/16/21 1200 72     Resp 02/16/21 1200 16     Temp 02/16/21 1200 98.7 F (37.1 C)     Temp Source 02/16/21 1200 Oral     SpO2 02/16/21 1200 98 %     Weight 02/16/21 1158 222 lb (100.7 kg)     Height 02/16/21 1158 5\' 2"  (1.575 m)     Head Circumference --      Peak Flow --      Pain Score 02/16/21 1158 6     Pain Loc --      Pain Edu? --      Excl. in GC? --    No data found.  Updated Vital Signs BP (!) 170/99 (BP Location: Right Arm)   Pulse 72   Temp 98.7 F (37.1 C) (Oral)   Resp 16   Ht 5\' 2"  (1.575 m)   Wt 222 lb (100.7 kg)   SpO2 98%   BMI 40.60 kg/m   Physical Exam Vitals and nursing note reviewed.  Constitutional:  Appearance: Normal appearance.  Cardiovascular:     Rate and Rhythm: Normal rate and regular rhythm.     Heart sounds: Normal heart sounds.  Pulmonary:     Effort: Pulmonary effort is normal.     Breath sounds: Normal breath sounds.  Musculoskeletal:        General: No swelling or tenderness. Normal range of motion.     Right lower leg: No edema.     Left lower leg: No edema.     Comments: No tenderness of midline spine. Moderate muscle tension and palpable reproducible muscle spasms throughout back and shoulders.   Skin:    General: Skin is warm.  Neurological:     Mental Status: She is alert and oriented to person, place, and time.     Motor: No weakness.     Coordination: Coordination normal.     Gait: Gait normal.     Deep Tendon Reflexes: Reflexes normal.     UC Treatments / Results  Labs (all labs ordered are listed, but only abnormal results are displayed) Labs Reviewed - No data to display  EKG   Radiology No results found.  Procedures Procedures (including critical care  time)  Medications Ordered in UC Medications - No data to display  Initial Impression / Assessment and Plan / UC Course  I have reviewed the triage vital signs and the nursing notes.  Pertinent labs & imaging results that were available during my care of the patient were reviewed by me and considered in my medical decision making (see chart for details).     New.  We discussed the need to continue follow-up with her specialist regarding her blood pressure and I have recommended a low-salt diet.  Hopefully getting her pain under control will also help with her blood pressure.  We discussed that I do not recommend NSAIDs or steroids at this time given her blood pressure as these medications would increase her blood pressure.  For now I am going to recommend muscle relaxers.  We discussed how to use along with common potential side effects and precautions.  I also recommended physical therapy. Final Clinical Impressions(s) / UC Diagnoses   Final diagnoses:  None   Discharge Instructions   None    ED Prescriptions   None    PDMP not reviewed this encounter.   Rushie Chestnut, [place] 02/16/21 1309

## 2021-06-27 DIAGNOSIS — Z6841 Body Mass Index (BMI) 40.0 and over, adult: Secondary | ICD-10-CM | POA: Diagnosis not present

## 2021-06-27 DIAGNOSIS — I10 Essential (primary) hypertension: Secondary | ICD-10-CM | POA: Diagnosis not present

## 2021-07-02 ENCOUNTER — Ambulatory Visit
Admission: EM | Admit: 2021-07-02 | Discharge: 2021-07-02 | Disposition: A | Discharge status: Home / Self Care | Payer: 59

## 2021-07-02 ENCOUNTER — Encounter: Payer: Self-pay | Admitting: Emergency Medicine

## 2021-07-02 ENCOUNTER — Other Ambulatory Visit: Payer: Self-pay

## 2021-07-02 DIAGNOSIS — I10 Essential (primary) hypertension: Secondary | ICD-10-CM | POA: Diagnosis not present

## 2021-07-02 DIAGNOSIS — R030 Elevated blood-pressure reading, without diagnosis of hypertension: Secondary | ICD-10-CM

## 2021-07-02 DIAGNOSIS — Z8679 Personal history of other diseases of the circulatory system: Secondary | ICD-10-CM | POA: Diagnosis not present

## 2021-07-02 DIAGNOSIS — J029 Acute pharyngitis, unspecified: Secondary | ICD-10-CM | POA: Diagnosis not present

## 2021-07-02 NOTE — ED Triage Notes (Addendum)
Pt c/o sore throat, cough. Started about 5 days ago. Denies fever. She states she had some amoxicillin left over from when she was sick before, she started taking them about 3 days ago without relief.

## 2021-07-02 NOTE — Discharge Instructions (Addendum)
Warm salt water gargles, Chloraseptic throat lozenges.  May take over-the-counter Coricidin HBP for sinus symptom management.  Follow-up with your PCP.  Please do not skip your blood pressure medication, take daily.  Return to urgent care for new or worsening issues or concerns.

## 2021-07-02 NOTE — ED Provider Notes (Signed)
[facility]    CSN: [id number] Arrival date & time: 07/02/21  1718      History   Chief Complaint Chief Complaint  Patient presents with   Sore Throat    HPI Savannah Bond is a 56 y.o. female.   56 year old female patient, Savannah Bond, presents to urgent care with chief complaint of sore throat and cough for 5 days.  Patient denies any fever ,shortness of breath, or chest pain.  States she had some leftover amoxicillin that she is taken twice a day for 3 days did not have any relief of symptoms.  States her sore throat is worse at night.  No known illness contacts  The history is provided by the patient. No language interpreter was used.   Past Medical History:  Diagnosis Date   Cholecystitis 08/2017   Hypertension     Patient Active Problem List   Diagnosis Date Noted   History of hypertension 07/02/2021   Elevated blood pressure reading 07/02/2021   Viral pharyngitis 07/02/2021   Acute cholecystitis 09/23/2017    Past Surgical History:  Procedure Laterality Date   ABDOMINAL HYSTERECTOMY     PARTIAL   CHOLECYSTECTOMY N/A 09/25/2017   Procedure: LAPAROSCOPIC CHOLECYSTECTOMY WITH INTRAOPERATIVE CHOLANGIOGRAM;  Surgeon: Berna Bue, MD;  Location: [facility] OR;  Service: General;  Laterality: N/A;   TUBAL LIGATION      OB History   No obstetric history on file.      Home Medications    Prior to Admission medications   Medication Sig Start Date End Date Taking? Authorizing Provider  amLODipine (NORVASC) 5 MG tablet Take by mouth. 06/27/21 09/25/21 Yes [provider]  hydrochlorothiazide (HYDRODIURIL) 25 MG tablet TAKE 1 TABLET(25 MG) BY MOUTH DAILY 11/30/17  Yes [provider]  lisinopril (ZESTRIL) 20 MG tablet Take 2 tablets (40 mg total) by mouth daily. 02/16/21   Rushie Chestnut, PA-C  methocarbamol (ROBAXIN) 500 MG tablet Take 1 tablet (500 mg total) by mouth 2 (two) times daily. 02/16/21   Rushie Chestnut, PA-C     Family History No family history on file.  Social History Social History   Tobacco Use   Smoking status: Never   Smokeless tobacco: Never  Vaping Use   Vaping Use: Never used  Substance Use Topics   Alcohol use: Never    Comment: OCCASIONAL   Drug use: Never     Allergies   Patient has no known allergies.   Review of Systems Review of Systems  Constitutional:  Negative for fever.  HENT:  Positive for sore throat.   Respiratory:  Positive for cough.   All other systems reviewed and are negative.   Physical Exam Triage Vital Signs ED Triage Vitals  Enc Vitals Group     BP 07/02/21 1740 (!) 177/122     Pulse Rate 07/02/21 1740 80     Resp 07/02/21 1740 18     Temp 07/02/21 1740 98.3 F (36.8 C)     Temp Source 07/02/21 1740 Oral     SpO2 07/02/21 1740 97 %     Weight 07/02/21 1737 222 lb 0.1 oz (100.7 kg)     Height 07/02/21 1737 5\' 2"  (1.575 m)     Head Circumference --      Peak Flow --      Pain Score 07/02/21 1736 7     Pain Loc --      Pain Edu? --      Excl. in  GC? --    No data found.  Updated Vital Signs BP (!) 177/122 (BP Location: Left Arm)    Pulse 80    Temp 98.3 F (36.8 C) (Oral)    Resp 18    Ht 5\' 2"  (1.575 m)    Wt 222 lb 0.1 oz (100.7 kg)    SpO2 97%    BMI 40.60 kg/m   Visual Acuity Right Eye Distance:   Left Eye Distance:   Bilateral Distance:    Right Eye Near:   Left Eye Near:    Bilateral Near:     Physical Exam Vitals and nursing note reviewed.  Constitutional:      General: She is not in acute distress.    Appearance: She is well-developed.  HENT:     Head: Normocephalic.     Right Ear: Tympanic membrane is retracted.     Left Ear: Tympanic membrane is retracted.     Nose: Congestion present.     Mouth/Throat:     Lips: Pink.     Mouth: Mucous membranes are moist.     Pharynx: Oropharynx is clear.  Eyes:     General: Lids are normal.     Conjunctiva/sclera: Conjunctivae normal.     Pupils: Pupils are equal,  round, and reactive to light.  Neck:     Trachea: No tracheal deviation.  Cardiovascular:     Rate and Rhythm: Regular rhythm.     Pulses: Normal pulses.     Heart sounds: Normal heart sounds. No murmur heard. Pulmonary:     Effort: Pulmonary effort is normal.     Breath sounds: Normal breath sounds.  Abdominal:     General: Bowel sounds are normal.     Palpations: Abdomen is soft.     Tenderness: There is no abdominal tenderness.  Musculoskeletal:        General: Normal range of motion.     Cervical back: Normal range of motion.  Lymphadenopathy:     Cervical: No cervical adenopathy.  Skin:    General: Skin is warm and dry.     Findings: No rash.  Neurological:     General: No focal deficit present.     Mental Status: She is alert and oriented to person, place, and time.     GCS: GCS eye subscore is 4. GCS verbal subscore is 5. GCS motor subscore is 6.  Psychiatric:        Speech: Speech normal.        Behavior: Behavior normal. Behavior is cooperative.     UC Treatments / Results  Labs (all labs ordered are listed, but only abnormal results are displayed) Labs Reviewed - No data to display  EKG   Radiology No results found.  Procedures Procedures (including critical care time)  Medications Ordered in UC Medications - No data to display  Initial Impression / Assessment and Plan / UC Course  I have reviewed the triage vital signs and the nursing notes.  Pertinent labs & imaging results that were available during my care of the patient were reviewed by me and considered in my medical decision making (see chart for details).     Ddx: Viral pharyngitis, viral illness, HTN Final Clinical Impressions(s) / UC Diagnoses   Final diagnoses:  History of hypertension  Elevated blood pressure reading  Viral pharyngitis     Discharge Instructions      Warm salt water gargles, Chloraseptic throat lozenges.  May take over-the-counter Coricidin HBP for  sinus  symptom management.  Follow-up with your PCP.  Please do not skip your blood pressure medication, take daily.  Return to urgent care for new or worsening issues or concerns.     ED Prescriptions   None    PDMP not reviewed this encounter.   Clancy Gourd, NP 07/02/21 2007

## 2021-07-26 DIAGNOSIS — E876 Hypokalemia: Secondary | ICD-10-CM | POA: Diagnosis not present

## 2021-07-26 DIAGNOSIS — E782 Mixed hyperlipidemia: Secondary | ICD-10-CM | POA: Diagnosis not present

## 2021-07-26 DIAGNOSIS — I10 Essential (primary) hypertension: Secondary | ICD-10-CM | POA: Diagnosis not present

## 2021-11-06 DIAGNOSIS — E782 Mixed hyperlipidemia: Secondary | ICD-10-CM | POA: Diagnosis not present

## 2021-11-06 DIAGNOSIS — I10 Essential (primary) hypertension: Secondary | ICD-10-CM | POA: Diagnosis not present

## 2021-12-12 DIAGNOSIS — E876 Hypokalemia: Secondary | ICD-10-CM | POA: Diagnosis not present

## 2021-12-12 DIAGNOSIS — R0989 Other specified symptoms and signs involving the circulatory and respiratory systems: Secondary | ICD-10-CM | POA: Diagnosis not present

## 2021-12-12 DIAGNOSIS — R062 Wheezing: Secondary | ICD-10-CM | POA: Diagnosis not present

## 2021-12-16 DIAGNOSIS — I10 Essential (primary) hypertension: Secondary | ICD-10-CM | POA: Diagnosis not present

## 2022-01-23 DIAGNOSIS — E876 Hypokalemia: Secondary | ICD-10-CM | POA: Diagnosis not present

## 2022-03-07 DIAGNOSIS — E876 Hypokalemia: Secondary | ICD-10-CM | POA: Diagnosis not present

## 2022-05-27 DIAGNOSIS — I10 Essential (primary) hypertension: Secondary | ICD-10-CM | POA: Diagnosis not present

## 2022-05-27 DIAGNOSIS — E782 Mixed hyperlipidemia: Secondary | ICD-10-CM | POA: Diagnosis not present

## 2022-05-27 DIAGNOSIS — Z23 Encounter for immunization: Secondary | ICD-10-CM | POA: Diagnosis not present

## 2022-06-13 DIAGNOSIS — I10 Essential (primary) hypertension: Secondary | ICD-10-CM | POA: Diagnosis not present

## 2022-07-28 DIAGNOSIS — R92323 Mammographic fibroglandular density, bilateral breasts: Secondary | ICD-10-CM | POA: Diagnosis not present

## 2022-07-28 DIAGNOSIS — Z1231 Encounter for screening mammogram for malignant neoplasm of breast: Secondary | ICD-10-CM | POA: Diagnosis not present

## 2022-08-25 DIAGNOSIS — Z23 Encounter for immunization: Secondary | ICD-10-CM | POA: Diagnosis not present

## 2022-08-25 DIAGNOSIS — R5383 Other fatigue: Secondary | ICD-10-CM | POA: Diagnosis not present

## 2022-08-25 DIAGNOSIS — I10 Essential (primary) hypertension: Secondary | ICD-10-CM | POA: Diagnosis not present

## 2022-08-25 DIAGNOSIS — Z130 Encounter for screening for diseases of the blood and blood-forming organs and certain disorders involving the immune mechanism: Secondary | ICD-10-CM | POA: Diagnosis not present

## 2022-08-25 DIAGNOSIS — Z1331 Encounter for screening for depression: Secondary | ICD-10-CM | POA: Diagnosis not present

## 2022-08-25 DIAGNOSIS — E782 Mixed hyperlipidemia: Secondary | ICD-10-CM | POA: Diagnosis not present

## 2022-12-09 DIAGNOSIS — Z1211 Encounter for screening for malignant neoplasm of colon: Secondary | ICD-10-CM | POA: Diagnosis not present

## 2022-12-11 ENCOUNTER — Ambulatory Visit: Payer: Self-pay | Admitting: Family Medicine

## 2022-12-18 ENCOUNTER — Encounter: Payer: Self-pay | Admitting: Nurse Practitioner

## 2022-12-18 ENCOUNTER — Ambulatory Visit: Payer: BC Managed Care – PPO | Admitting: Nurse Practitioner

## 2022-12-18 VITALS — BP 130/84 | HR 96 | Temp 98.4°F | Ht 62.0 in | Wt 234.2 lb

## 2022-12-18 DIAGNOSIS — E782 Mixed hyperlipidemia: Secondary | ICD-10-CM | POA: Diagnosis not present

## 2022-12-18 DIAGNOSIS — E611 Iron deficiency: Secondary | ICD-10-CM | POA: Diagnosis not present

## 2022-12-18 DIAGNOSIS — I739 Peripheral vascular disease, unspecified: Secondary | ICD-10-CM

## 2022-12-18 DIAGNOSIS — I10 Essential (primary) hypertension: Secondary | ICD-10-CM

## 2022-12-18 DIAGNOSIS — E876 Hypokalemia: Secondary | ICD-10-CM | POA: Diagnosis not present

## 2022-12-18 DIAGNOSIS — Z1329 Encounter for screening for other suspected endocrine disorder: Secondary | ICD-10-CM

## 2022-12-18 MED ORDER — ROSUVASTATIN CALCIUM 10 MG PO TABS
10.0000 mg | ORAL_TABLET | Freq: Every day | ORAL | 3 refills | Status: DC
Start: 2022-12-18 — End: 2024-01-07

## 2022-12-18 MED ORDER — FERROUS SULFATE 325 (65 FE) MG PO TBEC
1.0000 | DELAYED_RELEASE_TABLET | Freq: Every morning | ORAL | 3 refills | Status: DC
Start: 2022-12-18 — End: 2023-08-10

## 2022-12-18 MED ORDER — SPIRONOLACTONE 25 MG PO TABS
12.5000 mg | ORAL_TABLET | Freq: Every day | ORAL | 3 refills | Status: DC
Start: 2022-12-18 — End: 2023-01-01

## 2022-12-18 NOTE — Progress Notes (Signed)
Bethanie Dicker, NP-C Phone: [phone]  Savannah Bond is a 57 y.o. female who presents today to establish care.   She has noticed severe pain in her legs with walking, even short distances. She has to rest frequently due to the pain. The pain is resolved with rest and sitting then returns when she starts to walk again. She has also noticed increased pain and cramping in her legs at night. She did recently stop taking her potassium, when she stopped her hydrochlorothiazide, she is unsure if these are related. She denies any warmth or tenderness in her legs.   HYPERTENSION- She has not been taking her Hydrochlorothiazide for at least one month.  Disease Monitoring Home BP Monitoring- Not checking Chest pain- No    Dyspnea- No Medications Compliance-  Norvasc, Losartan. Lightheadedness-  No  Edema- Yes BMET    Component Value Date/Time   NA 137 09/26/2017 0504   K 3.5 09/26/2017 0504   CL 104 09/26/2017 0504   CO2 26 09/26/2017 0504   GLUCOSE 117 (H) 09/26/2017 0504   BUN 5 (L) 09/26/2017 0504   CREATININE 0.92 09/26/2017 0504   CALCIUM 8.9 09/26/2017 0504   GFRNONAA >60 09/26/2017 0504   GFRAA >60 09/26/2017 0504   HYPERLIPIDEMIA Symptoms Chest pain on exertion:  No   Leg claudication:   No Medications: Compliance- Crestor Right upper quadrant pain- No  Muscle aches- No Lipid Panel  No results found for: "CHOL", "TRIG", "HDL", "CHOLHDL", "VLDL", "LDLCALC", "LDLDIRECT", "LABVLDL"  Iron Deficiency- Patient has been out of her iron for approximately one month.   Active Ambulatory Problems    Diagnosis Date Noted   Acute cholecystitis 09/23/2017   Viral pharyngitis 07/02/2021   Primary hypertension 12/18/2022   Mixed hyperlipidemia 12/18/2022   Iron deficiency 12/18/2022   Hypokalemia 12/18/2022   Claudication of both lower extremities (HCC) 12/19/2022   Resolved Ambulatory Problems    Diagnosis Date Noted   History of hypertension 07/02/2021   Elevated blood pressure  reading 07/02/2021   Past Medical History:  Diagnosis Date   Cholecystitis 08/2017   Hyperlipidemia    Hypertension     Family History  Problem Relation Age of Onset   Alcohol abuse Mother    Hypertension Mother    Hypertension Sister     Social History   Socioeconomic History   Marital status: Single    Spouse name: Not on file   Number of children: Not on file   Years of education: Not on file   Highest education level: Not on file  Occupational History   Not on file  Tobacco Use   Smoking status: Never   Smokeless tobacco: Never  Vaping Use   Vaping status: Never Used  Substance and Sexual Activity   Alcohol use: Never    Comment: OCCASIONAL   Drug use: Never   Sexual activity: Not Currently  Other Topics Concern   Not on file  Social History Narrative   Not on file   Social Determinants of Health   Financial Resource Strain: Not on file  Food Insecurity: Not on file  Transportation Needs: Not on file  Physical Activity: Not on file  Stress: Not on file  Social Connections: Not on file  Intimate Partner Violence: Not on file    ROS  General:  Negative for unexplained weight loss, fever Skin: Negative for new or changing mole, sore that won't heal HEENT: Negative for trouble hearing, trouble seeing, ringing in ears, mouth sores, hoarseness, change in voice,  dysphagia. CV:  Negative for chest pain, dyspnea, palpitations Resp: Negative for cough, dyspnea, hemoptysis GI: Negative for nausea, vomiting, diarrhea, constipation, abdominal pain, melena, hematochezia. GU: Negative for dysuria, incontinence, urinary hesitance, hematuria, vaginal or penile discharge, polyuria, sexual difficulty, lumps in testicle or breasts MSK: Negative for joint pain Neuro: Negative for headaches, weakness, numbness, dizziness, passing out/fainting Psych: Negative for depression, anxiety, memory problems  Objective  Physical Exam Vitals:   12/18/22 1348  BP: 130/84   Pulse: 96  Temp: 98.4 F (36.9 C)  SpO2: 95%    BP Readings from Last 3 Encounters:  12/18/22 130/84  07/02/21 (!) 177/122  02/16/21 (!) 170/99   Wt Readings from Last 3 Encounters:  12/18/22 234 lb 3.2 oz (106.2 kg)  07/02/21 222 lb 0.1 oz (100.7 kg)  02/16/21 222 lb (100.7 kg)    Physical Exam Constitutional:      General: She is not in acute distress.    Appearance: Normal appearance.  HENT:     Head: Normocephalic.  Cardiovascular:     Rate and Rhythm: Normal rate and regular rhythm.     Heart sounds: Normal heart sounds.  Pulmonary:     Effort: Pulmonary effort is normal.     Breath sounds: Normal breath sounds.  Musculoskeletal:     Right lower leg: 2+ Edema present.     Left lower leg: 2+ Edema present.  Skin:    General: Skin is warm and dry.  Neurological:     General: No focal deficit present.     Mental Status: She is alert.  Psychiatric:        Mood and Affect: Mood normal.        Behavior: Behavior normal.    Assessment/Plan:   Primary hypertension Assessment & Plan: Chronic. Slightly elevated reading today in office. She will continue her Norvasc 10 mg daily and Losartan 25 mg daily. Will start patient on Spironolactone 12.5 mg daily. Lab work as outlined. She will return in 2 weeks to follow up.   Orders: -     CBC with Differential/Platelet -     Comprehensive metabolic panel -     Spironolactone; Take 0.5 tablets (12.5 mg total) by mouth daily.  Dispense: 45 tablet; Refill: 3  Mixed hyperlipidemia Assessment & Plan: Chronic. Currently taking Crestor 10 mg daily. Continue. Will check lipid panel today.   Orders: -     Rosuvastatin Calcium; Take 1 tablet (10 mg total) by mouth daily.  Dispense: 90 tablet; Refill: 3 -     Lipid panel  Claudication of both lower extremities (HCC) Assessment & Plan: Severe pain in bilateral lower extremities with walking. Resolved with rest. Vascular US ordered for further evaluation. Will continue to  monitor.   Orders: -     VAS [place] LOWER EXT ART SEG MULTI (SEGMENTALS & LE RAYNAUDS); Future  Hypokalemia Assessment & Plan: Noted when on Hydrochlorothiazide. Has not been taking her potassium for over a month since not taking Hydrochlorothiazide. Will check potassium today.    Iron deficiency Assessment & Plan: Chronic. Has not been taking her iron for the last month. Refills sent. Will check iron panel today.   Orders: -     Ferrous Sulfate; Take 1 tablet (325 mg total) by mouth every morning.  Dispense: 90 tablet; Refill: 3 -     IBC + Ferritin  Thyroid disorder screen -     TSH    Return in about 2 weeks (around 01/01/2023) for Blood pressure  check and labs then 3 month follow up.   Bethanie Dicker, NP-C [facility] - [employer]

## 2022-12-19 ENCOUNTER — Encounter: Payer: Self-pay | Admitting: Nurse Practitioner

## 2022-12-19 DIAGNOSIS — I739 Peripheral vascular disease, unspecified: Secondary | ICD-10-CM | POA: Insufficient documentation

## 2022-12-19 LAB — LIPID PANEL
Cholesterol: 160 mg/dL (ref 0–200)
HDL: 54.8 mg/dL (ref >39.00)
LDL Cholesterol: 72 mg/dL (ref 0–99)
NonHDL: 104.83
Total CHOL/HDL Ratio: 3
Triglycerides: 162 mg/dL — ABNORMAL HIGH (ref 0.0–149.0)
VLDL: 32.4 mg/dL (ref 0.0–40.0)

## 2022-12-19 LAB — CBC WITH DIFFERENTIAL/PLATELET
Basophils Absolute: 0.1 10*3/uL (ref 0.0–0.1)
Basophils Relative: 0.8 % (ref 0.0–3.0)
Eosinophils Absolute: 0.1 10*3/uL (ref 0.0–0.7)
Eosinophils Relative: 1.8 % (ref 0.0–5.0)
HCT: 40.7 % (ref 36.0–46.0)
Hemoglobin: 12.7 g/dL (ref 12.0–15.0)
Lymphocytes Relative: 36.6 % (ref 12.0–46.0)
Lymphs Abs: 2.6 10*3/uL (ref 0.7–4.0)
MCHC: 31.2 g/dL (ref 30.0–36.0)
MCV: 77.2 fl — ABNORMAL LOW (ref 78.0–100.0)
Monocytes Absolute: 0.5 10*3/uL (ref 0.1–1.0)
Monocytes Relative: 7.1 % (ref 3.0–12.0)
Neutro Abs: 3.8 10*3/uL (ref 1.4–7.7)
Neutrophils Relative %: 53.7 % (ref 43.0–77.0)
Platelets: 334 10*3/uL (ref 150.0–400.0)
RBC: 5.27 Mil/uL — ABNORMAL HIGH (ref 3.87–5.11)
RDW: 18.4 % — ABNORMAL HIGH (ref 11.5–15.5)
WBC: 7 10*3/uL (ref 4.0–10.5)

## 2022-12-19 LAB — COMPREHENSIVE METABOLIC PANEL
ALT: 12 U/L (ref 0–35)
AST: 13 U/L (ref 0–37)
Albumin: 4.4 g/dL (ref 3.5–5.2)
Alkaline Phosphatase: 103 U/L (ref 39–117)
BUN: 13 mg/dL (ref 6–23)
CO2: 27 mEq/L (ref 19–32)
Calcium: 9.9 mg/dL (ref 8.4–10.5)
Chloride: 104 mEq/L (ref 96–112)
Creatinine, Ser: 1.25 mg/dL — ABNORMAL HIGH (ref 0.40–1.20)
GFR: 48.38 mL/min — ABNORMAL LOW (ref >60.00)
Glucose, Bld: 88 mg/dL (ref 70–99)
Potassium: 3.8 mEq/L (ref 3.5–5.1)
Sodium: 141 mEq/L (ref 135–145)
Total Bilirubin: 0.7 mg/dL (ref 0.2–1.2)
Total Protein: 7.2 g/dL (ref 6.0–8.3)

## 2022-12-19 LAB — IBC + FERRITIN
Ferritin: 27.2 ng/mL (ref 10.0–291.0)
Iron: 35 ug/dL — ABNORMAL LOW (ref 42–145)
Saturation Ratios: 9.8 % — ABNORMAL LOW (ref 20.0–50.0)
TIBC: 357 ug/dL (ref 250.0–450.0)
Transferrin: 255 mg/dL (ref 212.0–360.0)

## 2022-12-19 LAB — TSH: TSH: 1.2 u[IU]/mL (ref 0.35–5.50)

## 2022-12-19 NOTE — Assessment & Plan Note (Addendum)
Noted when on Hydrochlorothiazide. Has not been taking her potassium for over a month since not taking Hydrochlorothiazide. Will check potassium today.

## 2022-12-19 NOTE — Assessment & Plan Note (Signed)
Chronic. Slightly elevated reading today in office. She will continue her Norvasc 10 mg daily and Losartan 25 mg daily. Will start patient on Spironolactone 12.5 mg daily. Lab work as outlined. She will return in 2 weeks to follow up.

## 2022-12-19 NOTE — Assessment & Plan Note (Signed)
Chronic. Has not been taking her iron for the last month. Refills sent. Will check iron panel today.

## 2022-12-19 NOTE — Assessment & Plan Note (Signed)
Chronic. Currently taking Crestor 10 mg daily. Continue. Will check lipid panel today.

## 2022-12-19 NOTE — Assessment & Plan Note (Signed)
Severe pain in bilateral lower extremities with walking. Resolved with rest. Vascular US ordered for further evaluation. Will continue to monitor.

## 2022-12-30 ENCOUNTER — Encounter: Payer: Self-pay | Admitting: Nurse Practitioner

## 2023-01-01 ENCOUNTER — Encounter: Payer: Self-pay | Admitting: Nurse Practitioner

## 2023-01-01 ENCOUNTER — Other Ambulatory Visit: Payer: Self-pay | Admitting: Nurse Practitioner

## 2023-01-01 ENCOUNTER — Telehealth (INDEPENDENT_AMBULATORY_CARE_PROVIDER_SITE_OTHER): Payer: BC Managed Care – PPO | Admitting: Nurse Practitioner

## 2023-01-01 ENCOUNTER — Ambulatory Visit: Payer: BC Managed Care – PPO

## 2023-01-01 VITALS — BP 128/86 | Ht 62.0 in | Wt 234.0 lb

## 2023-01-01 DIAGNOSIS — I10 Essential (primary) hypertension: Secondary | ICD-10-CM

## 2023-01-01 MED ORDER — HYDROCHLOROTHIAZIDE 12.5 MG PO CAPS
12.5000 mg | ORAL_CAPSULE | Freq: Every day | ORAL | 0 refills | Status: DC
Start: 2023-01-01 — End: 2023-03-19

## 2023-01-01 NOTE — Progress Notes (Signed)
MyChart Video Visit    Virtual Visit via Video Note   This visit type was conducted because this format is felt to be most appropriate for this patient at this time. Physical exam was limited by quality of the video and audio technology used for the visit. CMA was able to get the patient set up on a video visit.  Patient location: Work- in break room. Patient and provider in visit Provider location: Office  I discussed the limitations of evaluation and management by telemedicine and the availability of in person appointments. The patient expressed understanding and agreed to proceed.  Visit Date: 01/01/2023  Today's healthcare provider: Bethanie Dicker, NP     Subjective:    Patient ID: Savannah Bond, female    DOB: 09-29-1965, 57 y.o.   MRN: [id number]  Chief Complaint  Patient presents with   Medication Problem    Discuss blood pressure medication     HPI  Patient start on Spironolactone 12.5 mg daily for hypertension on 12/18/2022. She started the medication on 12/22/2022 and took the medication daily for one week. She then began having extreme leg cramps in both legs. She stopped the medication yesterday and has had relief of cramps today.   HYPERTENSION Disease Monitoring Home BP Monitoring- Not checking Chest pain- No    Dyspnea- No Medications Compliance-  Norvasc and Losartan. Lightheadedness-  No  Edema- No BMET    Component Value Date/Time   NA 141 12/18/2022 1442   K 3.8 12/18/2022 1442   CL 104 12/18/2022 1442   CO2 27 12/18/2022 1442   GLUCOSE 88 12/18/2022 1442   BUN 13 12/18/2022 1442   CREATININE 1.25 (H) 12/18/2022 1442   CALCIUM 9.9 12/18/2022 1442   GFRNONAA >60 09/26/2017 0504   GFRAA >60 09/26/2017 0504     Past Medical History:  Diagnosis Date   Cholecystitis 08/2017   Hyperlipidemia    Hypertension     Past Surgical History:  Procedure Laterality Date   ABDOMINAL HYSTERECTOMY     PARTIAL   CHOLECYSTECTOMY N/A 09/25/2017    Procedure: LAPAROSCOPIC CHOLECYSTECTOMY WITH INTRAOPERATIVE CHOLANGIOGRAM;  Surgeon: Berna Bue, MD;  Location: [facility] OR;  Service: General;  Laterality: N/A;   TUBAL LIGATION      Family History  Problem Relation Age of Onset   Alcohol abuse Mother    Hypertension Mother    Hypertension Sister     Social History   Socioeconomic History   Marital status: Single    Spouse name: Not on file   Number of children: Not on file   Years of education: Not on file   Highest education level: Not on file  Occupational History   Not on file  Tobacco Use   Smoking status: Never   Smokeless tobacco: Never  Vaping Use   Vaping status: Never Used  Substance and Sexual Activity   Alcohol use: Never    Comment: OCCASIONAL   Drug use: Never   Sexual activity: Not Currently  Other Topics Concern   Not on file  Social History Narrative   Not on file   Social Determinants of Health   Financial Resource Strain: Not on file  Food Insecurity: Not on file  Transportation Needs: Not on file  Physical Activity: Not on file  Stress: Not on file  Social Connections: Not on file  Intimate Partner Violence: Not on file    Outpatient Medications Prior to Visit  Medication Sig Dispense Refill   amLODipine (NORVASC)  10 MG tablet Take 10 mg by mouth daily.     ferrous sulfate 325 (65 FE) MG EC tablet Take 1 tablet (325 mg total) by mouth every morning. 90 tablet 3   losartan (COZAAR) 25 MG tablet Take 25 mg by mouth daily.     rosuvastatin (CRESTOR) 10 MG tablet Take 1 tablet (10 mg total) by mouth daily. 90 tablet 3   hydrochlorothiazide (HYDRODIURIL) 25 MG tablet Take 25 mg by mouth daily.     spironolactone (ALDACTONE) 25 MG tablet Take 0.5 tablets (12.5 mg total) by mouth daily. (Patient not taking: Reported on 01/01/2023) 45 tablet 3   No facility-administered medications prior to visit.    No Known Allergies  ROS See HPI    Objective:    Physical Exam  BP 128/86 Comment: NV  earlier today  Ht 5\' 2"  (1.575 m)   Wt 234 lb (106.1 kg)   BMI 42.80 kg/m  Wt Readings from Last 3 Encounters:  01/01/23 234 lb (106.1 kg)  12/18/22 234 lb 3.2 oz (106.2 kg)  07/02/21 222 lb 0.1 oz (100.7 kg)   GENERAL: alert, oriented, appears well and in no acute distress   HEENT: atraumatic, conjunttiva clear, no obvious abnormalities on inspection of external nose and ears   NECK: normal movements of the head and neck   LUNGS: on inspection no signs of respiratory distress, breathing rate appears normal, no obvious gross SOB, gasping or wheezing   CV: no obvious cyanosis   MS: moves all visible extremities without noticeable abnormality   PSYCH/NEURO: pleasant and cooperative, no obvious depression or anxiety, speech and thought processing grossly intact     Assessment & Plan:   Problem List Items Addressed This Visit       Cardiovascular and Mediastinum   Primary hypertension - Primary    BP at goal today. Patient unable to tolerate Spironolactone due to leg cramps. Will discontinue and start back on Hydrochlorothiazide 12.5 mg daily. She will continue her Losartan and Norvasc daily. She will return in 2 weeks for another BP check and lab work to monitor her potassium. She has been on Hydrochlorothiazide at higher doses in the past and had to start on oral potassium due to hypokalemia. Potassium WNL in most recent labs. Will monitor closely.       Relevant Medications   hydrochlorothiazide (MICROZIDE) 12.5 MG capsule    I have discontinued Aubrina Bechard's spironolactone. I am also having her start on hydrochlorothiazide. Additionally, I am having her maintain her ferrous sulfate, rosuvastatin, amLODipine, and losartan.  Meds ordered this encounter  Medications   hydrochlorothiazide (MICROZIDE) 12.5 MG capsule    Sig: Take 1 capsule (12.5 mg total) by mouth daily.    Dispense:  90 capsule    Refill:  0    Order Specific Question:   Supervising Provider    Answer:    Birdie Sons, ERIC G [[id number]]    I discussed the assessment and treatment plan with the patient. The patient was provided an opportunity to ask questions and all were answered. The patient agreed with the plan and demonstrated an understanding of the instructions.   The patient was advised to call back or seek an in-person evaluation if the symptoms worsen or if the condition fails to improve as anticipated.   Bethanie Dicker, NP [facility] [phone] (phone) [phone] (fax)  [facility]

## 2023-01-01 NOTE — Progress Notes (Signed)
Patient arrived for a BP check. BP 158/100 P 94  SpO2  97% Patient denied any symptoms of chest pain, chest tightness, headache, vision changes, pressure in eyes, sweating or any other symptoms.   Patient confirmed she had taken her medications late this morning. Patient took Amlodipine 10 mg ,hydrochlorothiazide , ferous sulfate 325 mg & rosuvastatin 10 mg within an hour of arriving. Patient sat for about 15 minutes and drank some water then I rechecked her BP.  BP  125/86  P  79  Patient did state that she was on Spironolactone 12.5 mg for 5 days then stopped it due to having bad leg cramps and having trouble walking. Patient's legs still bother her a little bit but she can do what she needs to since stopping the Spironolactone.

## 2023-01-01 NOTE — Assessment & Plan Note (Addendum)
BP at goal today. Patient unable to tolerate Spironolactone due to leg cramps. Will discontinue and start back on Hydrochlorothiazide 12.5 mg daily. She will continue her Losartan and Norvasc daily. She will return in 2 weeks for another BP check and lab work to monitor her potassium. She has been on Hydrochlorothiazide at higher doses in the past and had to start on oral potassium due to hypokalemia. Potassium WNL in most recent labs. Will monitor closely.

## 2023-01-07 DIAGNOSIS — R61 Generalized hyperhidrosis: Secondary | ICD-10-CM | POA: Diagnosis not present

## 2023-01-07 DIAGNOSIS — Z6841 Body Mass Index (BMI) 40.0 and over, adult: Secondary | ICD-10-CM | POA: Diagnosis not present

## 2023-01-07 DIAGNOSIS — E785 Hyperlipidemia, unspecified: Secondary | ICD-10-CM | POA: Diagnosis not present

## 2023-01-07 DIAGNOSIS — Z780 Asymptomatic menopausal state: Secondary | ICD-10-CM | POA: Diagnosis not present

## 2023-01-07 DIAGNOSIS — M2550 Pain in unspecified joint: Secondary | ICD-10-CM | POA: Diagnosis not present

## 2023-01-07 DIAGNOSIS — R5383 Other fatigue: Secondary | ICD-10-CM | POA: Diagnosis not present

## 2023-01-07 DIAGNOSIS — I10 Essential (primary) hypertension: Secondary | ICD-10-CM | POA: Diagnosis not present

## 2023-01-07 DIAGNOSIS — R454 Irritability and anger: Secondary | ICD-10-CM | POA: Diagnosis not present

## 2023-01-15 ENCOUNTER — Ambulatory Visit (INDEPENDENT_AMBULATORY_CARE_PROVIDER_SITE_OTHER): Payer: BC Managed Care – PPO

## 2023-01-15 DIAGNOSIS — I10 Essential (primary) hypertension: Secondary | ICD-10-CM

## 2023-01-15 LAB — BASIC METABOLIC PANEL
BUN: 12 mg/dL (ref 6–23)
CO2: 29 mEq/L (ref 19–32)
Calcium: 9.5 mg/dL (ref 8.4–10.5)
Chloride: 97 mEq/L (ref 96–112)
Creatinine, Ser: 0.91 mg/dL (ref 0.40–1.20)
GFR: 70.77 mL/min (ref >60.00)
Glucose, Bld: 87 mg/dL (ref 70–99)
Potassium: 3.3 mEq/L — ABNORMAL LOW (ref 3.5–5.1)
Sodium: 134 mEq/L — ABNORMAL LOW (ref 135–145)

## 2023-01-15 NOTE — Progress Notes (Signed)
Pt came in for a blood pressure check and labs. Pt stated she is experiencing some swelling and would like a order for compression socks.

## 2023-01-20 ENCOUNTER — Telehealth: Payer: Self-pay

## 2023-01-20 NOTE — Telephone Encounter (Signed)
Info documented under lab tab and routed to provider

## 2023-01-20 NOTE — Telephone Encounter (Signed)
LMOM for pt to CB in regards to labs 

## 2023-01-20 NOTE — Telephone Encounter (Signed)
Pt returned [facility] CMA call. Unable to transfer. Note from provider under labs was read to pt. Pt aware and understood.

## 2023-01-21 ENCOUNTER — Ambulatory Visit (INDEPENDENT_AMBULATORY_CARE_PROVIDER_SITE_OTHER): Payer: Self-pay

## 2023-01-21 DIAGNOSIS — I739 Peripheral vascular disease, unspecified: Secondary | ICD-10-CM

## 2023-01-23 LAB — VAS US LOWER EXT ART SEG MULTI (SEGMENTALS & LE RAYNAUDS)
Left ABI: 1.05
Right ABI: 1.08

## 2023-02-06 DIAGNOSIS — Z6841 Body Mass Index (BMI) 40.0 and over, adult: Secondary | ICD-10-CM | POA: Diagnosis not present

## 2023-03-09 DIAGNOSIS — E66813 Obesity, class 3: Secondary | ICD-10-CM | POA: Diagnosis not present

## 2023-03-09 DIAGNOSIS — Z6841 Body Mass Index (BMI) 40.0 and over, adult: Secondary | ICD-10-CM | POA: Diagnosis not present

## 2023-03-19 ENCOUNTER — Encounter: Payer: Self-pay | Admitting: Nurse Practitioner

## 2023-03-19 ENCOUNTER — Ambulatory Visit: Payer: BC Managed Care – PPO | Admitting: Nurse Practitioner

## 2023-03-19 VITALS — BP 124/82 | HR 87 | Temp 99.0°F | Ht 62.0 in | Wt 224.4 lb

## 2023-03-19 DIAGNOSIS — Z0000 Encounter for general adult medical examination without abnormal findings: Secondary | ICD-10-CM

## 2023-03-19 DIAGNOSIS — Z6841 Body Mass Index (BMI) 40.0 and over, adult: Secondary | ICD-10-CM

## 2023-03-19 DIAGNOSIS — I10 Essential (primary) hypertension: Secondary | ICD-10-CM

## 2023-03-19 DIAGNOSIS — E876 Hypokalemia: Secondary | ICD-10-CM

## 2023-03-19 LAB — BASIC METABOLIC PANEL
BUN: 16 mg/dL (ref 6–23)
CO2: 30 meq/L (ref 19–32)
Calcium: 9.6 mg/dL (ref 8.4–10.5)
Chloride: 101 meq/L (ref 96–112)
Creatinine, Ser: 0.96 mg/dL (ref 0.40–1.20)
GFR: 66.29 mL/min (ref >60.00)
Glucose, Bld: 91 mg/dL (ref 70–99)
Potassium: 3.3 meq/L — ABNORMAL LOW (ref 3.5–5.1)
Sodium: 141 meq/L (ref 135–145)

## 2023-03-19 LAB — HEMOGLOBIN A1C: Hgb A1c MFr Bld: 5.8 % (ref 4.6–6.5)

## 2023-03-19 MED ORDER — HYDROCHLOROTHIAZIDE 12.5 MG PO CAPS
12.5000 mg | ORAL_CAPSULE | Freq: Every day | ORAL | 3 refills | Status: DC
Start: 2023-03-19 — End: 2023-08-10

## 2023-03-19 MED ORDER — AMLODIPINE BESYLATE 10 MG PO TABS
10.0000 mg | ORAL_TABLET | Freq: Every day | ORAL | 3 refills | Status: DC
Start: 2023-03-19 — End: 2023-08-10

## 2023-03-19 MED ORDER — LOSARTAN POTASSIUM 25 MG PO TABS
25.0000 mg | ORAL_TABLET | Freq: Every day | ORAL | 3 refills | Status: DC
Start: 2023-03-19 — End: 2023-08-10

## 2023-03-19 MED ORDER — POTASSIUM CHLORIDE CRYS ER 20 MEQ PO TBCR
20.0000 meq | EXTENDED_RELEASE_TABLET | Freq: Every day | ORAL | 3 refills | Status: DC
Start: 2023-03-19 — End: 2023-08-10

## 2023-03-19 NOTE — Progress Notes (Signed)
Bethanie Dicker, NP-C Phone: [phone]  Savannah Bond is a 57 y.o. female who presents today for annual exam.She has no complaints or new concerns today. She is doing well on all of her medications.   Diet: Well balanced- cooks mostly at home, high protein Exercise: Walking, occasionally going to the gym- member at [employer] Pap smear: Hysterectomy in 2015- No longer indicated Colonoscopy: 12/09/2022- 10 year recall Mammogram: 07/28/2022 Family history-   Colon cancer: No  Breast cancer: No  Ovarian cancer: No Menses: Post menopausal Sexually active: No Vaccines-   Flu: Gets at work  Tetanus: 04/30/2016  Shingles: Completed  COVID19: Never HIV screening: Deferred Hep C Screening: Deferred Tobacco use: No Alcohol use: Yes, 1-2 glasses of wine per week Illicit Drug use: No Dentist: Yes Ophthalmology: Yes   Social History   Tobacco Use  Smoking Status Never  Smokeless Tobacco Never    Current Outpatient Medications on File Prior to Visit  Medication Sig Dispense Refill   ferrous sulfate 325 (65 FE) MG EC tablet Take 1 tablet (325 mg total) by mouth every morning. 90 tablet 3   rosuvastatin (CRESTOR) 10 MG tablet Take 1 tablet (10 mg total) by mouth daily. 90 tablet 3   No current facility-administered medications on file prior to visit.    ROS see history of present illness  Objective  Physical Exam Vitals:   03/19/23 0824  BP: 124/82  Pulse: 87  Temp: 99 F (37.2 C)  SpO2: 97%    BP Readings from Last 3 Encounters:  03/19/23 124/82  01/15/23 126/78  01/01/23 128/86   Wt Readings from Last 3 Encounters:  03/19/23 224 lb 6.4 oz (101.8 kg)  01/01/23 234 lb (106.1 kg)  12/18/22 234 lb 3.2 oz (106.2 kg)    Physical Exam Constitutional:      General: She is not in acute distress.    Appearance: Normal appearance.  HENT:     Head: Normocephalic.     Right Ear: Tympanic membrane normal.     Left Ear: Tympanic membrane normal.     Nose: Nose  normal.     Mouth/Throat:     Mouth: Mucous membranes are moist.     Pharynx: Oropharynx is clear.  Eyes:     Conjunctiva/sclera: Conjunctivae normal.     Pupils: Pupils are equal, round, and reactive to light.  Neck:     Thyroid: No thyromegaly.  Cardiovascular:     Rate and Rhythm: Normal rate and regular rhythm.     Heart sounds: Normal heart sounds.  Pulmonary:     Effort: Pulmonary effort is normal.     Breath sounds: Normal breath sounds.  Abdominal:     General: Abdomen is flat. Bowel sounds are normal.     Palpations: Abdomen is soft. There is no mass.     Tenderness: There is no abdominal tenderness.  Musculoskeletal:        General: Normal range of motion.  Lymphadenopathy:     Cervical: No cervical adenopathy.  Skin:    General: Skin is warm and dry.     Findings: No rash.  Neurological:     General: No focal deficit present.     Mental Status: She is alert.  Psychiatric:        Mood and Affect: Mood normal.        Behavior: Behavior normal.    Assessment/Plan: Please see individual problem list.  Preventative health care Assessment & Plan: Physical exam complete. Lab work  as outlined. Will contact patient with results. Pap- no longer indicated. Colonoscopy- UTD. Mammogram- UTD. She will get her flu vaccine through her work. Tetanus vaccine- UTD. Shingles vaccine- series completed. Declined all COVID vaccines. HIV and Hep C screenings- deferred. Recommended follow ups with Dentist and Ophthalmology for annual exams. Return to care in 6 months, sooner PRN.    Primary hypertension Assessment & Plan: Chronic issue. Stable on Losartan, Norvasc and Hydrochlorothiazide daily. Continue. Refills sent.   Orders: -     amLODIPine Besylate; Take 1 tablet (10 mg total) by mouth daily.  Dispense: 90 tablet; Refill: 3 -     hydroCHLOROthiazide; Take 1 capsule (12.5 mg total) by mouth daily.  Dispense: 90 capsule; Refill: 3 -     Losartan Potassium; Take 1 tablet (25 mg  total) by mouth daily.  Dispense: 90 tablet; Refill: 3  Hypokalemia Assessment & Plan: Noted since starting back on Hydrochlorothiazide in August. She has not been consistently taking her potassium. Discussed importance of taking daily. Refills sent. Will check BMP today.   Orders: -     Potassium Chloride Crys ER; Take 1 tablet (20 mEq total) by mouth daily.  Dispense: 90 tablet; Refill: 3 -     Basic metabolic panel  Morbid obesity with BMI of 40.0-44.9, adult [facility]) Assessment & Plan: Seeing a weight loss clinic in [facility]. Currently taking Phentermine daily. Will check A1c today. Encouraged healthy diet and exercise.   Orders: -     Hemoglobin A1c   Return in about 6 months (around 09/17/2023) for Follow up.   Bethanie Dicker, NP-C [facility] [employer] [facility]

## 2023-03-20 ENCOUNTER — Ambulatory Visit: Payer: BC Managed Care – PPO | Admitting: Nurse Practitioner

## 2023-03-31 ENCOUNTER — Encounter: Payer: Self-pay | Admitting: Nurse Practitioner

## 2023-03-31 DIAGNOSIS — Z0000 Encounter for general adult medical examination without abnormal findings: Secondary | ICD-10-CM | POA: Insufficient documentation

## 2023-03-31 NOTE — Assessment & Plan Note (Signed)
Seeing a weight loss clinic in [facility]. Currently taking Phentermine daily. Will check A1c today. Encouraged healthy diet and exercise.

## 2023-03-31 NOTE — Assessment & Plan Note (Signed)
Noted since starting back on Hydrochlorothiazide in August. She has not been consistently taking her potassium. Discussed importance of taking daily. Refills sent. Will check BMP today.

## 2023-03-31 NOTE — Assessment & Plan Note (Signed)
Physical exam complete. Lab work as outlined. Will contact patient with results. Pap- no longer indicated. Colonoscopy- UTD. Mammogram- UTD. She will get her flu vaccine through her work. Tetanus vaccine- UTD. Shingles vaccine- series completed. Declined all COVID vaccines. HIV and Hep C screenings- deferred. Recommended follow ups with Dentist and Ophthalmology for annual exams. Return to care in 6 months, sooner PRN.

## 2023-03-31 NOTE — Assessment & Plan Note (Signed)
Chronic issue. Stable on Losartan, Norvasc and Hydrochlorothiazide daily. Continue. Refills sent.

## 2023-04-08 ENCOUNTER — Encounter: Payer: Self-pay | Admitting: Nurse Practitioner

## 2023-04-08 ENCOUNTER — Telehealth: Payer: Self-pay

## 2023-04-08 NOTE — Telephone Encounter (Signed)
Pt will need a PA for Ozempic

## 2023-04-09 ENCOUNTER — Telehealth: Payer: Self-pay

## 2023-04-09 ENCOUNTER — Other Ambulatory Visit: Payer: Self-pay | Admitting: Nurse Practitioner

## 2023-04-09 MED ORDER — ZEPBOUND 5 MG/0.5ML ~~LOC~~ SOAJ
5.0000 mg | SUBCUTANEOUS | 0 refills | Status: DC
Start: 2023-04-09 — End: 2023-08-06

## 2023-04-09 MED ORDER — TIRZEPATIDE 5 MG/0.5ML ~~LOC~~ SOAJ
5.0000 mg | SUBCUTANEOUS | 0 refills | Status: DC
Start: 2023-04-09 — End: 2023-04-09

## 2023-04-09 MED ORDER — TIRZEPATIDE 2.5 MG/0.5ML ~~LOC~~ SOAJ
2.5000 mg | SUBCUTANEOUS | 0 refills | Status: DC
Start: 2023-04-09 — End: 2023-04-09

## 2023-04-09 MED ORDER — ZEPBOUND 2.5 MG/0.5ML ~~LOC~~ SOAJ: 2.5000 mg | SUBCUTANEOUS | 0 refills | Status: DC

## 2023-04-09 NOTE — Assessment & Plan Note (Signed)
Will start the patient on Zepbound. Counseled on the risk of pancreatitis and gallbladder disease. Discussed the risk of nausea. Advised to discontinue the Zepbound and contact us immediately if they develop abdominal pain. If they develop excessive nausea they will contact us right away. I discussed that medullary thyroid cancer has been seen in rats studies. The patient confirmed no personal or family history of thyroid cancer, parathyroid cancer, or adrenal gland cancer. Discussed that we thus far have not seen medullary thyroid cancer result from use of this type of medication in humans. Advised to monitor the thyroid area and contact us for any lumps, swelling, trouble swallowing, or any other changes in this area.  Discussed goal weight loss of 1 to 2 pounds a week while on this medication. Discussed the importance of healthy diet, exercise and lifestyle modifications even with this medication.  

## 2023-04-09 NOTE — Telephone Encounter (Signed)
Pt needs a a PA on zepbound    Provider Bethanie Dicker

## 2023-04-13 NOTE — Telephone Encounter (Signed)
Pt is not diabetic, PA will be denied

## 2023-04-16 ENCOUNTER — Other Ambulatory Visit (HOSPITAL_COMMUNITY): Payer: Self-pay

## 2023-04-17 ENCOUNTER — Telehealth: Payer: Self-pay

## 2023-04-17 ENCOUNTER — Other Ambulatory Visit (HOSPITAL_COMMUNITY): Payer: Self-pay

## 2023-04-17 NOTE — Telephone Encounter (Signed)
PA request has been Submitted. New Encounter created for follow up. For additional info see Pharmacy Prior Auth telephone encounter from 04/17/23.

## 2023-04-17 NOTE — Telephone Encounter (Signed)
Pharmacy Patient Advocate Encounter  Received notification from [facility] that Prior Authorization for ZEPBOUND has been CANCELLED due to: Prior Authorization Not Required    Per test claim: ANTI-OBESITY MEDICATION NOT COVERED PRODUCT OR SERVICE NOT COVERED.

## 2023-04-17 NOTE — Telephone Encounter (Signed)
Pharmacy Patient Advocate Encounter   Received notification from Pt Calls Messages that prior authorization for Zepbound 2.5MG /0.5ML pen-injectors is required/requested.   Insurance verification completed.   The patient is insured through [facility] .   Per test claim: PA required; PA started via CoverMyMeds. KEY [id number] . Waiting for clinical questions to populate.

## 2023-04-23 DIAGNOSIS — E785 Hyperlipidemia, unspecified: Secondary | ICD-10-CM | POA: Diagnosis not present

## 2023-04-23 DIAGNOSIS — E66813 Obesity, class 3: Secondary | ICD-10-CM | POA: Diagnosis not present

## 2023-04-23 DIAGNOSIS — R5383 Other fatigue: Secondary | ICD-10-CM | POA: Diagnosis not present

## 2023-04-23 DIAGNOSIS — Z780 Asymptomatic menopausal state: Secondary | ICD-10-CM | POA: Diagnosis not present

## 2023-04-23 DIAGNOSIS — Z6839 Body mass index (BMI) 39.0-39.9, adult: Secondary | ICD-10-CM | POA: Diagnosis not present

## 2023-06-19 ENCOUNTER — Encounter: Payer: Self-pay | Admitting: Nurse Practitioner

## 2023-07-23 ENCOUNTER — Ambulatory Visit: Payer: BC Managed Care – PPO | Admitting: Nurse Practitioner

## 2023-08-06 ENCOUNTER — Encounter: Payer: Self-pay | Admitting: Nurse Practitioner

## 2023-08-06 ENCOUNTER — Ambulatory Visit: Payer: BC Managed Care – PPO | Admitting: Nurse Practitioner

## 2023-08-06 VITALS — BP 142/100 | HR 89 | Temp 98.8°F | Ht 62.0 in | Wt 223.2 lb

## 2023-08-06 DIAGNOSIS — I10 Essential (primary) hypertension: Secondary | ICD-10-CM | POA: Diagnosis not present

## 2023-08-06 DIAGNOSIS — E876 Hypokalemia: Secondary | ICD-10-CM | POA: Diagnosis not present

## 2023-08-06 DIAGNOSIS — Z1231 Encounter for screening mammogram for malignant neoplasm of breast: Secondary | ICD-10-CM

## 2023-08-06 DIAGNOSIS — Z6841 Body Mass Index (BMI) 40.0 and over, adult: Secondary | ICD-10-CM

## 2023-08-06 DIAGNOSIS — E611 Iron deficiency: Secondary | ICD-10-CM

## 2023-08-06 DIAGNOSIS — E782 Mixed hyperlipidemia: Secondary | ICD-10-CM

## 2023-08-06 NOTE — Progress Notes (Signed)
 Bethanie Dicker, NP-C Phone: [phone]  Savannah Bond is a 58 y.o. female who presents today for follow up.   Discussed the use of AI scribe software for clinical note transcription with the patient, who gave verbal consent to proceed.  History of Present Illness   Savannah Bond is a 58 year old female with hypertension who presents for a six-month follow-up.  Her blood pressure was elevated today at 140/90 mmHg, which she attributes to taking her medication late. She usually takes her medication on time when she goes to work but sometimes forgets when at home. She does not regularly check her blood pressure at home. No chest pain, shortness of breath, or dizziness. Swelling has improved significantly since resuming hydrochlorothiazide and wearing compression socks. She continues to take potassium supplements.  She is actively trying to manage her weight and has started walking for about 30 minutes daily, although she does not enjoy it. She has lost about two pounds, going from 225 to 223 pounds. She occasionally takes phentermine but has reduced the dose to half due to side effects. She is attending a weight loss clinic in [facility]. Her diet includes more vegetables and meat, with minimal starches and carbohydrates. She plans to eliminate sugar in April and has already completed a dry January and February. She is not a regular drinker but occasionally consumes wine.  She had a partial hysterectomy in January 2015 and retains her ovaries. She is in menopause, confirmed by previous blood work. She is due for a mammogram, which she plans to schedule at a local breast center.      Social History   Tobacco Use  Smoking Status Never  Smokeless Tobacco Never    Current Outpatient Medications on File Prior to Visit  Medication Sig Dispense Refill   rosuvastatin (CRESTOR) 10 MG tablet Take 1 tablet (10 mg total) by mouth daily. 90 tablet 3   No current facility-administered medications  on file prior to visit.    ROS see history of present illness  Objective  Physical Exam Vitals:   08/06/23 1514 08/06/23 1539  BP: (!) 140/90 (!) 142/100  Pulse: 89   Temp: 98.8 F (37.1 C)   SpO2: 95%     BP Readings from Last 3 Encounters:  08/06/23 (!) 142/100  03/19/23 124/82  01/15/23 126/78   Wt Readings from Last 3 Encounters:  08/06/23 223 lb 3.2 oz (101.2 kg)  03/19/23 224 lb 6.4 oz (101.8 kg)  01/01/23 234 lb (106.1 kg)    Physical Exam Constitutional:      General: She is not in acute distress.    Appearance: Normal appearance. She is obese.  HENT:     Head: Normocephalic.  Cardiovascular:     Rate and Rhythm: Normal rate and regular rhythm.     Heart sounds: Normal heart sounds.  Pulmonary:     Effort: Pulmonary effort is normal.     Breath sounds: Normal breath sounds.  Skin:    General: Skin is warm and dry.  Neurological:     General: No focal deficit present.     Mental Status: She is alert.  Psychiatric:        Mood and Affect: Mood normal.        Behavior: Behavior normal.     Assessment/Plan: Please see individual problem list.  Primary hypertension Assessment & Plan: Blood pressure is elevated at 140/90 and 142/100, likely due to delayed medication intake as she just took her medication prior to  the appointment. Denies chest pain or shortness of breath. Swelling has improved with compression socks and hydrochlorothiazide. Potassium supplementation is ongoing. Continue Losartan 25 mg daily, Norvasc 10 mg daily, and Hydrochlorothiazide 12.5 mg daily. Encourage consistent medication adherence. Consider home blood pressure monitoring. We will continue to monitor and consider increasing Losartan if remaining elevated.   Orders: -     CBC with Differential/Platelet -     Comprehensive metabolic panel  Mixed hyperlipidemia Assessment & Plan: She is on Crestor 10 mg daily with no reported side effects. Continue Crestor. The 10-year ASCVD risk  score (Arnett DK, et al., 2019) is: 6.1%.    Morbid obesity with BMI of 40.0-44.9, adult [facility]) Assessment & Plan: Minimal weight loss of 2 pounds with intermittent phentermine use. She is engaged in a weight loss program and a 31-day walking challenge, attempting lifestyle changes including no sugar in April. Encourage continuation of walking and lifestyle modifications. Monitor weight loss progress. Support her efforts in the weight management program. Discuss potential side effects and alternative weight loss medications if needed.   Iron deficiency Assessment & Plan: She continues iron supplements with no new symptoms. Order blood work to recheck iron levels.  Orders: -     IBC + Ferritin  Hypokalemia Assessment & Plan: Taking potassium supplement once daily. Likely due to use of hydrochlorothiazide. We will check CMP today.   Orders: -     Comprehensive metabolic panel   Return in about 7 months (around 03/21/2024) for Annual Exam, sooner as needed.   Bethanie Dicker, NP-C [facility]

## 2023-08-07 LAB — CBC WITH DIFFERENTIAL/PLATELET
Basophils Absolute: 0 10*3/uL (ref 0.0–0.1)
Basophils Relative: 0.4 % (ref 0.0–3.0)
Eosinophils Absolute: 0.1 10*3/uL (ref 0.0–0.7)
Eosinophils Relative: 1.3 % (ref 0.0–5.0)
HCT: 40.3 % (ref 36.0–46.0)
Hemoglobin: 12.9 g/dL (ref 12.0–15.0)
Lymphocytes Relative: 41.1 % (ref 12.0–46.0)
Lymphs Abs: 2.7 10*3/uL (ref 0.7–4.0)
MCHC: 32 g/dL (ref 30.0–36.0)
MCV: 79 fl (ref 78.0–100.0)
Monocytes Absolute: 0.5 10*3/uL (ref 0.1–1.0)
Monocytes Relative: 7.4 % (ref 3.0–12.0)
Neutro Abs: 3.3 10*3/uL (ref 1.4–7.7)
Neutrophils Relative %: 49.8 % (ref 43.0–77.0)
Platelets: 331 10*3/uL (ref 150.0–400.0)
RBC: 5.1 Mil/uL (ref 3.87–5.11)
RDW: 16.3 % — ABNORMAL HIGH (ref 11.5–15.5)
WBC: 6.5 10*3/uL (ref 4.0–10.5)

## 2023-08-07 LAB — IBC + FERRITIN
Ferritin: 49.1 ng/mL (ref 10.0–291.0)
Iron: 23 ug/dL — ABNORMAL LOW (ref 42–145)
Saturation Ratios: 7.5 % — ABNORMAL LOW (ref 20.0–50.0)
TIBC: 305.2 ug/dL (ref 250.0–450.0)
Transferrin: 218 mg/dL (ref 212.0–360.0)

## 2023-08-07 LAB — COMPREHENSIVE METABOLIC PANEL
ALT: 12 U/L (ref 0–35)
AST: 12 U/L (ref 0–37)
Albumin: 4.2 g/dL (ref 3.5–5.2)
Alkaline Phosphatase: 79 U/L (ref 39–117)
BUN: 15 mg/dL (ref 6–23)
CO2: 28 meq/L (ref 19–32)
Calcium: 9.3 mg/dL (ref 8.4–10.5)
Chloride: 102 meq/L (ref 96–112)
Creatinine, Ser: 0.85 mg/dL (ref 0.40–1.20)
GFR: 76.5 mL/min (ref >60.00)
Glucose, Bld: 88 mg/dL (ref 70–99)
Potassium: 3.4 meq/L — ABNORMAL LOW (ref 3.5–5.1)
Sodium: 139 meq/L (ref 135–145)
Total Bilirubin: 0.5 mg/dL (ref 0.2–1.2)
Total Protein: 7.3 g/dL (ref 6.0–8.3)

## 2023-08-10 ENCOUNTER — Encounter: Payer: Self-pay | Admitting: Nurse Practitioner

## 2023-08-10 ENCOUNTER — Other Ambulatory Visit: Payer: Self-pay

## 2023-08-10 ENCOUNTER — Other Ambulatory Visit: Payer: Self-pay | Admitting: Nurse Practitioner

## 2023-08-10 DIAGNOSIS — I10 Essential (primary) hypertension: Secondary | ICD-10-CM

## 2023-08-10 DIAGNOSIS — E611 Iron deficiency: Secondary | ICD-10-CM

## 2023-08-10 MED ORDER — FERROUS SULFATE 325 (65 FE) MG PO TBEC: 1.0000 | DELAYED_RELEASE_TABLET | Freq: Two times a day (BID) | ORAL | 3 refills | Status: DC

## 2023-08-10 MED ORDER — POTASSIUM CHLORIDE CRYS ER 20 MEQ PO TBCR: 20.0000 meq | EXTENDED_RELEASE_TABLET | Freq: Two times a day (BID) | ORAL | 3 refills | Status: DC

## 2023-08-10 MED ORDER — AMLODIPINE BESYLATE 10 MG PO TABS: 10.0000 mg | ORAL_TABLET | Freq: Every day | ORAL | 3 refills | Status: DC

## 2023-08-10 MED ORDER — HYDROCHLOROTHIAZIDE 12.5 MG PO CAPS: 12.5000 mg | ORAL_CAPSULE | Freq: Every day | ORAL | 3 refills | Status: DC

## 2023-08-10 MED ORDER — LOSARTAN POTASSIUM 25 MG PO TABS: 25.0000 mg | ORAL_TABLET | Freq: Every day | ORAL | 3 refills | Status: DC

## 2023-08-10 NOTE — Assessment & Plan Note (Addendum)
 Taking potassium supplement once daily. Likely due to use of hydrochlorothiazide. We will check CMP today.

## 2023-08-10 NOTE — Assessment & Plan Note (Signed)
 Minimal weight loss of 2 pounds with intermittent phentermine use. She is engaged in a weight loss program and a 31-day walking challenge, attempting lifestyle changes including no sugar in April. Encourage continuation of walking and lifestyle modifications. Monitor weight loss progress. Support her efforts in the weight management program. Discuss potential side effects and alternative weight loss medications if needed.

## 2023-08-10 NOTE — Assessment & Plan Note (Signed)
 She is on Crestor 10 mg daily with no reported side effects. Continue Crestor. The 10-year ASCVD risk score (Arnett DK, et al., 2019) is: 6.1%.

## 2023-08-10 NOTE — Patient Instructions (Signed)
 YOUR MAMMOGRAM IS DUE, PLEASE CALL AND GET THIS SCHEDULED! [facility] - call [phone]

## 2023-08-10 NOTE — Assessment & Plan Note (Signed)
 She continues iron supplements with no new symptoms. Order blood work to recheck iron levels.

## 2023-08-10 NOTE — Assessment & Plan Note (Signed)
 Blood pressure is elevated at 140/90 and 142/100, likely due to delayed medication intake as she just took her medication prior to the appointment. Denies chest pain or shortness of breath. Swelling has improved with compression socks and hydrochlorothiazide. Potassium supplementation is ongoing. Continue Losartan 25 mg daily, Norvasc 10 mg daily, and Hydrochlorothiazide 12.5 mg daily. Encourage consistent medication adherence. Consider home blood pressure monitoring. We will continue to monitor and consider increasing Losartan if remaining elevated.

## 2023-08-13 ENCOUNTER — Inpatient Hospital Stay: Attending: Oncology | Admitting: Oncology

## 2023-08-13 ENCOUNTER — Inpatient Hospital Stay

## 2023-08-13 ENCOUNTER — Encounter: Payer: Self-pay | Admitting: Oncology

## 2023-08-13 VITALS — BP 137/91 | HR 87 | Temp 96.7°F | Resp 20 | Wt 225.5 lb

## 2023-08-13 DIAGNOSIS — E611 Iron deficiency: Secondary | ICD-10-CM | POA: Diagnosis not present

## 2023-08-13 DIAGNOSIS — R0683 Snoring: Secondary | ICD-10-CM | POA: Insufficient documentation

## 2023-08-13 DIAGNOSIS — R29818 Other symptoms and signs involving the nervous system: Secondary | ICD-10-CM

## 2023-08-13 NOTE — Progress Notes (Signed)
 Patient has no concerns

## 2023-08-16 DIAGNOSIS — R29818 Other symptoms and signs involving the nervous system: Secondary | ICD-10-CM | POA: Insufficient documentation

## 2023-08-16 NOTE — Progress Notes (Signed)
 Hematology/Oncology Consult note [phone] [phone]        REFERRING PROVIDER: Bethanie Dicker, NP   CHIEF COMPLAINTS/REASON FOR VISIT:  Evaluation of iron deficiency anemia   ASSESSMENT & PLAN:   Iron deficiency Iron deficiency without anemia confirmed by low iron saturation and ferritin levels. Oral iron tolerated well. Etiology unclear post-hysterectomy and normal colonoscopy. Differential includes malabsorption or occult bleeding. Previous iron infusions in 2013. IV iron considered if oral supplementation fails. - Continue oral ferrous sulfate 325mg  BID twice daily. - Add vitamin C to enhance absorption. - Reassess iron levels in three months. - Consider IV iron if no improvement.  Suspected sleep apnea Suspected sleep apnea due to unrefreshing sleep and snoring. Potential link to iron deficiency via increased erythropoiesis from hypoxia. - Discuss sleep study with primary care.   Orders Placed This Encounter  Procedures   CBC with Differential (Cancer Center Only)    Standing Status:   Future    Expected Date:   11/13/2023    Expiration Date:   08/12/2024   Iron and TIBC    Standing Status:   Future    Expected Date:   11/13/2023    Expiration Date:   08/12/2024   Ferritin    Standing Status:   Future    Expected Date:   11/13/2023    Expiration Date:   08/12/2024   Retic Panel    Standing Status:   Future    Expected Date:   11/13/2023    Expiration Date:   08/12/2024   Follow up in 3 months All questions were answered. The patient knows to call the clinic with any problems, questions or concerns.  Rickard Patience, MD, PhD [facility] Hematology Oncology 08/13/2023   HISTORY OF PRESENTING ILLNESS:   Savannah Bond is a  58 y.o.  female with PMH listed below was seen in consultation at the request of  Bethanie Dicker, NP  for evaluation of iron deficiency anemia.   She was notified of her iron deficiency last week and has been on iron pills since  July 2024, initially taking them once a day and recently increasing to twice a day. She experiences no side effects such as stomach pain, constipation, diarrhea, or nausea from the iron pills. Blood work from July 2024 showed an iron saturation of 9.8% and a ferritin level of 27.2 ng/mL. Recent blood work from August 06, 2023, showed a decreased iron saturation of 7.5% and a ferritin level of 49 ng/mL, indicating persistent iron deficiency without anemia.  She has a history of iron infusions in 2013, which she received quarterly in [place], but has not required them since undergoing a hysterectomy. No history of gastric bypass or other abdominal surgeries except for gallbladder removal. No symptoms such as dark or tarry stools, stomach pain, bloating, gas, or acid reflux. A colonoscopy a couple of months ago at [facility] showed no polyps and was reported as normal. No history of blood thinners.  She reports not feeling refreshed in the morning and experiences snoring, attributing her poor sleep to menopause-related symptoms such as hot flashes and night sweats.    MEDICAL HISTORY:  Past Medical History:  Diagnosis Date   Cholecystitis 08/2017   Hyperlipidemia    Hypertension     SURGICAL HISTORY: Past Surgical History:  Procedure Laterality Date   ABDOMINAL HYSTERECTOMY     PARTIAL   CHOLECYSTECTOMY N/A 09/25/2017   Procedure: LAPAROSCOPIC CHOLECYSTECTOMY WITH INTRAOPERATIVE CHOLANGIOGRAM;  Surgeon: Berna Bue, MD;  Location: [facility] OR;  Service: General;  Laterality: N/A;   TUBAL LIGATION      SOCIAL HISTORY: Social History   Socioeconomic History   Marital status: Single    Spouse name: Not on file   Number of children: Not on file   Years of education: Not on file   Highest education level: Not on file  Occupational History   Not on file  Tobacco Use   Smoking status: Never   Smokeless tobacco: Never  Vaping Use   Vaping status: Never Used  Substance and Sexual Activity    Alcohol use: Never    Comment: OCCASIONAL   Drug use: Never   Sexual activity: Not Currently    Birth control/protection: None  Other Topics Concern   Not on file  Social History Narrative   Not on file   Social Drivers of Health   Financial Resource Strain: Low Risk  (04/23/2023)   Received from [facility]   Overall Financial Resource Strain (CARDIA)    Difficulty of Paying Living Expenses: Not very hard  Food Insecurity: No Food Insecurity (04/23/2023)   Received from [facility]   Hunger Vital Sign    Worried About Running Out of Food in the Last Year: Never true    Ran Out of Food in the Last Year: Never true  Transportation Needs: No Transportation Needs (04/23/2023)   Received from [facility] - Transportation    Lack of Transportation (Medical): No    Lack of Transportation (Non-Medical): No  Physical Activity: Not on file  Stress: Not on file  Social Connections: Unknown (01/05/2023)   Received from [facility]   Social Network    Social Network: Not on file  Intimate Partner Violence: Unknown (01/05/2023)   Received from [facility]   HITS    Physically Hurt: Not on file    Insult or Talk Down To: Not on file    Threaten Physical Harm: Not on file    Scream or Curse: Not on file    FAMILY HISTORY: Family History  Problem Relation Age of Onset   Alcohol abuse Mother    Hypertension Mother    Hypertension Sister     ALLERGIES:  has no known allergies.  MEDICATIONS:  Current Outpatient Medications  Medication Sig Dispense Refill   amLODipine (NORVASC) 10 MG tablet Take 1 tablet (10 mg total) by mouth daily. 90 tablet 3   ferrous sulfate 325 (65 FE) MG EC tablet Take 1 tablet (325 mg total) by mouth in the morning and at bedtime. 180 tablet 3   hydrochlorothiazide (MICROZIDE) 12.5 MG capsule Take 1 capsule (12.5 mg total) by mouth daily. 90 capsule 3   losartan (COZAAR) 25 MG tablet Take 1 tablet (25 mg total) by mouth daily. 90 tablet 3    potassium chloride SA (KLOR-CON M) 20 MEQ tablet Take 1 tablet (20 mEq total) by mouth 2 (two) times daily. 180 tablet 3   rosuvastatin (CRESTOR) 10 MG tablet Take 1 tablet (10 mg total) by mouth daily. 90 tablet 3   No current facility-administered medications for this visit.    Review of Systems  Constitutional:  Positive for fatigue. Negative for appetite change, chills and fever.  HENT:   Negative for hearing loss and voice change.   Eyes:  Negative for eye problems.  Respiratory:  Negative for chest tightness and cough.   Cardiovascular:  Negative for chest pain.  Gastrointestinal:  Negative for abdominal distention, abdominal pain  and blood in stool.  Endocrine: Negative for hot flashes.  Genitourinary:  Negative for difficulty urinating and frequency.   Musculoskeletal:  Negative for arthralgias.  Skin:  Negative for itching and rash.  Neurological:  Negative for extremity weakness.  Hematological:  Negative for adenopathy.  Psychiatric/Behavioral:  Negative for confusion.    PHYSICAL EXAMINATION:  Vitals:   08/13/23 1525  BP: (!) 137/91  Pulse: 87  Resp: 20  Temp: (!) 96.7 F (35.9 C)  SpO2: 100%   Filed Weights   08/13/23 1525  Weight: 225 lb 8 oz (102.3 kg)    Physical Exam Constitutional:      General: She is not in acute distress. HENT:     Head: Normocephalic and atraumatic.  Eyes:     General: No scleral icterus. Cardiovascular:     Rate and Rhythm: Normal rate and regular rhythm.     Heart sounds: Normal heart sounds.  Pulmonary:     Effort: Pulmonary effort is normal. No respiratory distress.     Breath sounds: No wheezing.  Abdominal:     General: Bowel sounds are normal. There is no distension.     Palpations: Abdomen is soft.  Musculoskeletal:        General: No deformity. Normal range of motion.     Cervical back: Normal range of motion and neck supple.  Skin:    General: Skin is warm and dry.     Findings: No erythema or rash.   Neurological:     Mental Status: She is alert and oriented to person, place, and time. Mental status is at baseline.  Psychiatric:        Mood and Affect: Mood normal.     LABORATORY DATA:  I have reviewed the data as listed    Latest Ref Rng & Units 08/06/2023    3:37 PM 12/18/2022    2:42 PM 09/26/2017    5:04 AM  CBC  WBC 4.0 - 10.5 K/uL 6.5  7.0  10.3   Hemoglobin 12.0 - 15.0 g/dL 16.1  09.6  04.5   Hematocrit 36.0 - 46.0 % 40.3  40.7  38.1   Platelets 150.0 - 400.0 K/uL 331.0  334.0  295       Latest Ref Rng & Units 08/06/2023    3:37 PM 03/19/2023    8:54 AM 01/15/2023   11:47 AM  CMP  Glucose 70 - 99 mg/dL 88  91  87   BUN 6 - 23 mg/dL 15  16  12    Creatinine 0.40 - 1.20 mg/dL 4.09  8.11  9.14   Sodium 135 - 145 mEq/L 139  141  134   Potassium 3.5 - 5.1 mEq/L 3.4  3.3  3.3   Chloride 96 - 112 mEq/L 102  101  97   CO2 19 - 32 mEq/L 28  30  29    Calcium 8.4 - 10.5 mg/dL 9.3  9.6  9.5   Total Protein 6.0 - 8.3 g/dL 7.3     Total Bilirubin 0.2 - 1.2 mg/dL 0.5     Alkaline Phos 39 - 117 U/L 79     AST 0 - 37 U/L 12     ALT 0 - 35 U/L 12         RADIOGRAPHIC STUDIES: I have personally reviewed the radiological images as listed and agreed with the findings in the report. No results found.

## 2023-08-16 NOTE — Assessment & Plan Note (Signed)
 Iron deficiency without anemia confirmed by low iron saturation and ferritin levels. Oral iron tolerated well. Etiology unclear post-hysterectomy and normal colonoscopy. Differential includes malabsorption or occult bleeding. Previous iron infusions in 2013. IV iron considered if oral supplementation fails. - Continue oral ferrous sulfate 325mg  BID twice daily. - Add vitamin C to enhance absorption. - Reassess iron levels in three months. - Consider IV iron if no improvement.

## 2023-08-16 NOTE — Assessment & Plan Note (Signed)
 Suspected sleep apnea due to unrefreshing sleep and snoring. Potential link to iron deficiency via increased erythropoiesis from hypoxia. - Discuss sleep study with primary care.

## 2023-10-20 ENCOUNTER — Encounter (INDEPENDENT_AMBULATORY_CARE_PROVIDER_SITE_OTHER): Payer: Self-pay

## 2023-11-16 ENCOUNTER — Inpatient Hospital Stay: Attending: Oncology

## 2023-11-16 DIAGNOSIS — E611 Iron deficiency: Secondary | ICD-10-CM | POA: Insufficient documentation

## 2023-11-19 ENCOUNTER — Inpatient Hospital Stay

## 2023-11-19 ENCOUNTER — Encounter: Payer: Self-pay | Admitting: Oncology

## 2023-11-19 ENCOUNTER — Inpatient Hospital Stay (HOSPITAL_BASED_OUTPATIENT_CLINIC_OR_DEPARTMENT_OTHER): Attending: Oncology | Admitting: Oncology

## 2023-11-19 ENCOUNTER — Ambulatory Visit: Payer: Self-pay | Admitting: Oncology

## 2023-11-19 VITALS — BP 139/93 | HR 85 | Temp 97.9°F | Resp 16 | Ht 62.0 in | Wt 224.0 lb

## 2023-11-19 DIAGNOSIS — E611 Iron deficiency: Secondary | ICD-10-CM

## 2023-11-19 DIAGNOSIS — R29818 Other symptoms and signs involving the nervous system: Secondary | ICD-10-CM | POA: Diagnosis not present

## 2023-11-19 LAB — CBC WITH DIFFERENTIAL (CANCER CENTER ONLY)
Abs Immature Granulocytes: 0.01 10*3/uL (ref 0.00–0.07)
Basophils Absolute: 0 10*3/uL (ref 0.0–0.1)
Basophils Relative: 0 %
Eosinophils Absolute: 0.1 10*3/uL (ref 0.0–0.5)
Eosinophils Relative: 1 %
HCT: 41.7 % (ref 36.0–46.0)
Hemoglobin: 13.1 g/dL (ref 12.0–15.0)
Immature Granulocytes: 0 %
Lymphocytes Relative: 40 %
Lymphs Abs: 2.3 10*3/uL (ref 0.7–4.0)
MCH: 25.3 pg — ABNORMAL LOW (ref 26.0–34.0)
MCHC: 31.4 g/dL (ref 30.0–36.0)
MCV: 80.5 fL (ref 80.0–100.0)
Monocytes Absolute: 0.4 10*3/uL (ref 0.1–1.0)
Monocytes Relative: 7 %
Neutro Abs: 3 10*3/uL (ref 1.7–7.7)
Neutrophils Relative %: 52 %
Platelet Count: 308 10*3/uL (ref 150–400)
RBC: 5.18 MIL/uL — ABNORMAL HIGH (ref 3.87–5.11)
RDW: 16.2 % — ABNORMAL HIGH (ref 11.5–15.5)
WBC Count: 5.9 10*3/uL (ref 4.0–10.5)
nRBC: 0 % (ref 0.0–0.2)

## 2023-11-19 LAB — IRON AND TIBC
Iron: 37 ug/dL (ref 28–170)
Saturation Ratios: 12 % (ref 10.4–31.8)
TIBC: 315 ug/dL (ref 250–450)
UIBC: 278 ug/dL

## 2023-11-19 LAB — RETIC PANEL
Immature Retic Fract: 12.6 % (ref 2.3–15.9)
RBC.: 5.11 MIL/uL (ref 3.87–5.11)
Retic Count, Absolute: 98.1 10*3/uL (ref 19.0–186.0)
Retic Ct Pct: 1.9 % (ref 0.4–3.1)
Reticulocyte Hemoglobin: 27.8 pg — ABNORMAL LOW (ref >27.9)

## 2023-11-19 LAB — FERRITIN: Ferritin: 35 ng/mL (ref 11–307)

## 2023-11-19 NOTE — Progress Notes (Signed)
 Fatigue/weakness: NO Dyspena: NO Light headedness: NO Blood in stool: NO   Left leg swelling x2 weeks that she's noticed.

## 2023-11-19 NOTE — Progress Notes (Signed)
 Hematology/Oncology Consult note [phone] [phone]        REFERRING PROVIDER: Bluford Burkitt, NP   CHIEF COMPLAINTS/REASON FOR VISIT:  Evaluation of iron deficiency anemia   ASSESSMENT & PLAN:   Iron deficiency Iron deficiency without anemia  Lab Results  Component Value Date   HGB 13.1 11/19/2023   TIBC 315 11/19/2023   IRONPCTSAT 12 11/19/2023   FERRITIN 35 11/19/2023    Hemoglobin remains normal.  Iron saturation has improved. Recommend patient to continue oral iron supplementation.  No need for IV Venofer treatments.  Suspected sleep apnea Suspected sleep apnea due to unrefreshing sleep and snoring.  Recommend patient to discuss with sleep study with primary care provider.   No orders of the defined types were placed in this encounter.  Follow up as needed All questions were answered. The patient knows to call the clinic with any problems, questions or concerns.  Timmy Forbes, MD, PhD [facility] Hematology Oncology 11/19/2023   HISTORY OF PRESENTING ILLNESS:   Savannah Bond is a  59 y.o.  female with PMH listed below was seen in consultation at the request of  Bluford Burkitt, NP  for evaluation of iron deficiency anemia.   She was notified of her iron deficiency last week and has been on iron pills since July 2024, initially taking them once a day and recently increasing to twice a day. She experiences no side effects such as stomach pain, constipation, diarrhea, or nausea from the iron pills. Blood work from July 2024 showed an iron saturation of 9.8% and a ferritin level of 27.2 ng/mL. Recent blood work from August 06, 2023, showed a decreased iron saturation of 7.5% and a ferritin level of 49 ng/mL, indicating persistent iron deficiency without anemia.  She has a history of iron infusions in 2013, which she received quarterly in [place], but has not required them since undergoing a hysterectomy. No history of gastric bypass or other abdominal  surgeries except for gallbladder removal. No symptoms such as dark or tarry stools, stomach pain, bloating, gas, or acid reflux. A colonoscopy a couple of months ago at [facility] showed no polyps and was reported as normal. No history of blood thinners.  She reports not feeling refreshed in the morning and experiences snoring, attributing her poor sleep to menopause-related symptoms such as hot flashes and night sweats.   INTERVAL HISTORY Savannah Bond is a 58 y.o. female who has above history reviewed by me today presents for follow up visit for iron deficiency without anemia. Patient has been on oral iron supplementation ferrous sulfate  325 mg twice daily.  MEDICAL HISTORY:  Past Medical History:  Diagnosis Date   Cholecystitis 08/2017   Hyperlipidemia    Hypertension     SURGICAL HISTORY: Past Surgical History:  Procedure Laterality Date   ABDOMINAL HYSTERECTOMY     PARTIAL   CHOLECYSTECTOMY N/A 09/25/2017   Procedure: LAPAROSCOPIC CHOLECYSTECTOMY WITH INTRAOPERATIVE CHOLANGIOGRAM;  Surgeon: Adalberto Acton, MD;  Location: [facility] OR;  Service: General;  Laterality: N/A;   TUBAL LIGATION      SOCIAL HISTORY: Social History   Socioeconomic History   Marital status: Single    Spouse name: Not on file   Number of children: Not on file   Years of education: Not on file   Highest education level: Not on file  Occupational History   Not on file  Tobacco Use   Smoking status: Never   Smokeless tobacco: Never  Vaping Use   Vaping status: Never  Used  Substance and Sexual Activity   Alcohol use: Never    Comment: OCCASIONAL   Drug use: Never   Sexual activity: Not Currently    Birth control/protection: None  Other Topics Concern   Not on file  Social History Narrative   Not on file   Social Drivers of Health   Financial Resource Strain: Low Risk  (04/23/2023)   Received from [facility]   Overall Financial Resource Strain (CARDIA)    Difficulty of Paying Living  Expenses: Not very hard  Food Insecurity: No Food Insecurity (04/23/2023)   Received from [facility]   Hunger Vital Sign    Within the past 12 months, you worried that your food would run out before you got the money to buy more.: Never true    Within the past 12 months, the food you bought just didn't last and you didn't have money to get more.: Never true  Transportation Needs: No Transportation Needs (04/23/2023)   Received from [facility] - Transportation    Lack of Transportation (Medical): No    Lack of Transportation (Non-Medical): No  Physical Activity: Not on file  Stress: Not on file  Social Connections: Unknown (01/05/2023)   Received from [facility]   Social Network    Social Network: Not on file  Intimate Partner Violence: Unknown (01/05/2023)   Received from [facility]   HITS    Physically Hurt: Not on file    Insult or Talk Down To: Not on file    Threaten Physical Harm: Not on file    Scream or Curse: Not on file    FAMILY HISTORY: Family History  Problem Relation Age of Onset   Alcohol abuse Mother    Hypertension Mother    Hypertension Sister     ALLERGIES:  has no known allergies.  MEDICATIONS:  Current Outpatient Medications  Medication Sig Dispense Refill   amLODipine  (NORVASC ) 10 MG tablet Take 1 tablet (10 mg total) by mouth daily. 90 tablet 3   ferrous sulfate  325 (65 FE) MG EC tablet Take 1 tablet (325 mg total) by mouth in the morning and at bedtime. 180 tablet 3   hydrochlorothiazide  (MICROZIDE ) 12.5 MG capsule Take 1 capsule (12.5 mg total) by mouth daily. 90 capsule 3   losartan  (COZAAR ) 25 MG tablet Take 1 tablet (25 mg total) by mouth daily. 90 tablet 3   potassium chloride  SA (KLOR-CON  M) 20 MEQ tablet Take 1 tablet (20 mEq total) by mouth 2 (two) times daily. 180 tablet 3   rosuvastatin  (CRESTOR ) 10 MG tablet Take 1 tablet (10 mg total) by mouth daily. 90 tablet 3   No current facility-administered medications for this  visit.    Review of Systems  Constitutional:  Positive for fatigue. Negative for appetite change, chills and fever.  HENT:   Negative for hearing loss and voice change.   Eyes:  Negative for eye problems.  Respiratory:  Negative for chest tightness and cough.   Cardiovascular:  Negative for chest pain.  Gastrointestinal:  Negative for abdominal distention, abdominal pain and blood in stool.  Endocrine: Negative for hot flashes.  Genitourinary:  Negative for difficulty urinating and frequency.   Musculoskeletal:  Negative for arthralgias.  Skin:  Negative for itching and rash.  Neurological:  Negative for extremity weakness.  Hematological:  Negative for adenopathy.  Psychiatric/Behavioral:  Negative for confusion.    PHYSICAL EXAMINATION:  Vitals:   11/19/23 1421  BP: (!) 139/93  Pulse: 85  Resp: 16  Temp: 97.9 F (36.6 C)  SpO2: 95%   Filed Weights   11/19/23 1421  Weight: 224 lb (101.6 kg)    Physical Exam Constitutional:      General: She is not in acute distress. HENT:     Head: Normocephalic and atraumatic.   Eyes:     General: No scleral icterus.   Cardiovascular:     Rate and Rhythm: Normal rate and regular rhythm.     Heart sounds: Normal heart sounds.  Pulmonary:     Effort: Pulmonary effort is normal. No respiratory distress.     Breath sounds: No wheezing.  Abdominal:     General: Bowel sounds are normal. There is no distension.     Palpations: Abdomen is soft.   Musculoskeletal:        General: No deformity. Normal range of motion.     Cervical back: Normal range of motion and neck supple.   Skin:    General: Skin is warm and dry.     Findings: No erythema or rash.   Neurological:     Mental Status: She is alert and oriented to person, place, and time. Mental status is at baseline.   Psychiatric:        Mood and Affect: Mood normal.     LABORATORY DATA:  I have reviewed the data as listed    Latest Ref Rng & Units 11/19/2023    2:04  PM 08/06/2023    3:37 PM 12/18/2022    2:42 PM  CBC  WBC 4.0 - 10.5 K/uL 5.9  6.5  7.0   Hemoglobin 12.0 - 15.0 g/dL 16.1  09.6  04.5   Hematocrit 36.0 - 46.0 % 41.7  40.3  40.7   Platelets 150 - 400 K/uL 308  331.0  334.0       Latest Ref Rng & Units 08/06/2023    3:37 PM 03/19/2023    8:54 AM 01/15/2023   11:47 AM  CMP  Glucose 70 - 99 mg/dL 88  91  87   BUN 6 - 23 mg/dL 15  16  12    Creatinine 0.40 - 1.20 mg/dL 4.09  8.11  9.14   Sodium 135 - 145 mEq/L 139  141  134   Potassium 3.5 - 5.1 mEq/L 3.4  3.3  3.3   Chloride 96 - 112 mEq/L 102  101  97   CO2 19 - 32 mEq/L 28  30  29    Calcium  8.4 - 10.5 mg/dL 9.3  9.6  9.5   Total Protein 6.0 - 8.3 g/dL 7.3     Total Bilirubin 0.2 - 1.2 mg/dL 0.5     Alkaline Phos 39 - 117 U/L 79     AST 0 - 37 U/L 12     ALT 0 - 35 U/L 12         RADIOGRAPHIC STUDIES: I have personally reviewed the radiological images as listed and agreed with the findings in the report. No results found.

## 2023-11-19 NOTE — Assessment & Plan Note (Signed)
 Suspected sleep apnea due to unrefreshing sleep and snoring.  Recommend patient to discuss with sleep study with primary care provider.

## 2023-11-19 NOTE — Assessment & Plan Note (Signed)
 Iron deficiency without anemia  Lab Results  Component Value Date   HGB 13.1 11/19/2023   TIBC 315 11/19/2023   IRONPCTSAT 12 11/19/2023   FERRITIN 35 11/19/2023    Hemoglobin remains normal.  Iron saturation has improved. Recommend patient to continue oral iron supplementation.  No need for IV Venofer treatments.

## 2023-11-23 NOTE — Progress Notes (Signed)
 Called pt and VM left with MD recommendations.

## 2024-01-07 ENCOUNTER — Ambulatory Visit: Admitting: Nurse Practitioner

## 2024-01-07 VITALS — BP 130/86 | HR 70 | Temp 98.6°F | Ht 62.0 in | Wt 223.4 lb

## 2024-01-07 DIAGNOSIS — E782 Mixed hyperlipidemia: Secondary | ICD-10-CM

## 2024-01-07 DIAGNOSIS — Z6841 Body Mass Index (BMI) 40.0 and over, adult: Secondary | ICD-10-CM

## 2024-01-07 DIAGNOSIS — Z1231 Encounter for screening mammogram for malignant neoplasm of breast: Secondary | ICD-10-CM

## 2024-01-07 DIAGNOSIS — I10 Essential (primary) hypertension: Secondary | ICD-10-CM | POA: Diagnosis not present

## 2024-01-07 LAB — HEMOGLOBIN A1C: Hgb A1c MFr Bld: 5.8 % (ref 4.6–6.5)

## 2024-01-07 MED ORDER — ROSUVASTATIN CALCIUM 10 MG PO TABS: 10.0000 mg | ORAL_TABLET | Freq: Every day | ORAL | 3 refills | Status: DC

## 2024-01-07 NOTE — Progress Notes (Signed)
 Leron Glance, NP-C Phone: [phone]  Savannah Bond is a 58 y.o. female who presents today for weight management.   Discussed the use of AI scribe software for clinical note transcription with the patient, who gave verbal consent to proceed.  History of Present Illness   Savannah Bond is a 58 year old female with hyperlipidemia and hypertension who presents for a Crestor  refill and weight loss consultation.  She requires a refill of her Crestor  medication for cholesterol management. She had run out of the medication but has resumed taking it. She is also interested in exploring weight loss options, having previously tried phentermine but discontinued it due to adverse effects. She is considering other weight loss medications, including injections like Zepbound  and Wegovy, but is encountering insurance coverage issues.  She is currently on amlodipine , losartan , and hydrochlorothiazide  for hypertension. She does not monitor her blood pressure at home but adheres to her medication regimen daily.  Regarding her weight loss journey, she attributes her challenges to menopause and a lack of appetite, stating 'I'm not a big eater.' She acknowledges the importance of exercise and has been going to the gym, which improves her well-being. She recently moved to a new place and is adjusting to her new environment.  No chest pain, shortness of breath, dizziness, swelling in legs, or abdominal pain.      Social History   Tobacco Use  Smoking Status Never  Smokeless Tobacco Never    Current Outpatient Medications on File Prior to Visit  Medication Sig Dispense Refill   amLODipine  (NORVASC ) 10 MG tablet Take 1 tablet (10 mg total) by mouth daily. 90 tablet 3   ferrous sulfate  325 (65 FE) MG EC tablet Take 1 tablet (325 mg total) by mouth in the morning and at bedtime. 180 tablet 3   hydrochlorothiazide  (MICROZIDE ) 12.5 MG capsule Take 1 capsule (12.5 mg total) by mouth daily. 90 capsule 3    losartan  (COZAAR ) 25 MG tablet Take 1 tablet (25 mg total) by mouth daily. 90 tablet 3   potassium chloride  SA (KLOR-CON  M) 20 MEQ tablet Take 1 tablet (20 mEq total) by mouth 2 (two) times daily. 180 tablet 3   No current facility-administered medications on file prior to visit.     ROS see history of present illness  Objective  Physical Exam Vitals:   01/07/24 1301  BP: 130/86  Pulse: 70  Temp: 98.6 F (37 C)  SpO2: 97%    BP Readings from Last 3 Encounters:  01/07/24 130/86  11/19/23 (!) 139/93  08/13/23 (!) 137/91   Wt Readings from Last 3 Encounters:  01/07/24 223 lb 6.4 oz (101.3 kg)  11/19/23 224 lb (101.6 kg)  08/13/23 225 lb 8 oz (102.3 kg)    Physical Exam Constitutional:      General: She is not in acute distress.    Appearance: Normal appearance.  HENT:     Head: Normocephalic.  Cardiovascular:     Rate and Rhythm: Normal rate and regular rhythm.     Heart sounds: Normal heart sounds.  Pulmonary:     Effort: Pulmonary effort is normal.     Breath sounds: Normal breath sounds.  Skin:    General: Skin is warm and dry.  Neurological:     General: No focal deficit present.     Mental Status: She is alert.  Psychiatric:        Mood and Affect: Mood normal.        Behavior: Behavior normal.  Assessment/Plan: Please see individual problem list.  Morbid obesity with BMI of 40.0-44.9, adult [facility]) Assessment & Plan: Discussed obesity management with a focus on weight loss. Phentermine is not tolerated, and insurance does not cover Zepbound  or Wegovy. Contrave is considered as an alternative. Lifestyle improvements are necessary. Consider referral to the Healthy Weight and Wellness program. Encourage a high protein, low carb diet and regular exercise. She will continue working on lifestyle modifications and decide if she is interested in trying Contrave or the HWW program. We will continue to monitor.   Orders: -     Hemoglobin A1c  Mixed  hyperlipidemia Assessment & Plan: Managed with Crestor , with no new symptoms reported. Continue Crestor  10 mg daily. Refill Crestor  prescription. Check lipid panel.   Orders: -     Rosuvastatin  Calcium ; Take 1 tablet (10 mg total) by mouth daily.  Dispense: 90 tablet; Refill: 3 -     Lipid panel  Primary hypertension Assessment & Plan: Hypertension is adequately controlled with amlodipine , losartan , and hydrochlorothiazide . Blood pressure remains stable with no symptoms. Continue current antihypertensive medications and encourage regular blood pressure monitoring at home.  Orders: -     Comprehensive metabolic panel with GFR  Screening mammogram for breast cancer -     3D Screening Mammogram, Left and Right; Future     Return in about 6 months (around 07/09/2024) for Follow up.   Leron Glance, NP-C [facility]

## 2024-01-08 LAB — LIPID PANEL
Cholesterol: 186 mg/dL (ref 0–200)
HDL: 57.9 mg/dL (ref >39.00)
LDL Cholesterol: 104 mg/dL — ABNORMAL HIGH (ref 0–99)
NonHDL: 127.68
Total CHOL/HDL Ratio: 3
Triglycerides: 120 mg/dL (ref 0.0–149.0)
VLDL: 24 mg/dL (ref 0.0–40.0)

## 2024-01-08 LAB — COMPREHENSIVE METABOLIC PANEL WITH GFR
ALT: 11 U/L (ref 0–35)
AST: 13 U/L (ref 0–37)
Albumin: 4.4 g/dL (ref 3.5–5.2)
Alkaline Phosphatase: 81 U/L (ref 39–117)
BUN: 14 mg/dL (ref 6–23)
CO2: 28 meq/L (ref 19–32)
Calcium: 9.7 mg/dL (ref 8.4–10.5)
Chloride: 99 meq/L (ref 96–112)
Creatinine, Ser: 0.86 mg/dL (ref 0.40–1.20)
GFR: 75.22 mL/min (ref >60.00)
Glucose, Bld: 88 mg/dL (ref 70–99)
Potassium: 4 meq/L (ref 3.5–5.1)
Sodium: 141 meq/L (ref 135–145)
Total Bilirubin: 0.7 mg/dL (ref 0.2–1.2)
Total Protein: 7.5 g/dL (ref 6.0–8.3)

## 2024-01-11 ENCOUNTER — Ambulatory Visit: Payer: Self-pay | Admitting: Nurse Practitioner

## 2024-01-14 ENCOUNTER — Ambulatory Visit: Admitting: Nurse Practitioner

## 2024-01-20 ENCOUNTER — Encounter: Payer: Self-pay | Admitting: Nurse Practitioner

## 2024-01-20 NOTE — Assessment & Plan Note (Signed)
 Managed with Crestor , with no new symptoms reported. Continue Crestor  10 mg daily. Refill Crestor  prescription. Check lipid panel.

## 2024-01-20 NOTE — Assessment & Plan Note (Signed)
 Hypertension is adequately controlled with amlodipine , losartan , and hydrochlorothiazide . Blood pressure remains stable with no symptoms. Continue current antihypertensive medications and encourage regular blood pressure monitoring at home.

## 2024-01-20 NOTE — Assessment & Plan Note (Signed)
 Discussed obesity management with a focus on weight loss. Phentermine is not tolerated, and insurance does not cover Zepbound  or Wegovy. Contrave is considered as an alternative. Lifestyle improvements are necessary. Consider referral to the [facility] program. Encourage a high protein, low carb diet and regular exercise. She will continue working on lifestyle modifications and decide if she is interested in trying Contrave or the HWW program. We will continue to monitor.

## 2024-03-16 ENCOUNTER — Encounter: Admitting: Nurse Practitioner

## 2024-04-25 ENCOUNTER — Encounter: Admitting: Nurse Practitioner

## 2024-06-29 ENCOUNTER — Encounter: Payer: Self-pay | Admitting: Nurse Practitioner

## 2024-06-29 ENCOUNTER — Ambulatory Visit: Admitting: Nurse Practitioner

## 2024-06-29 VITALS — BP 138/98 | HR 70 | Temp 98.6°F | Ht 62.0 in | Wt 230.6 lb

## 2024-06-29 DIAGNOSIS — E559 Vitamin D deficiency, unspecified: Secondary | ICD-10-CM

## 2024-06-29 DIAGNOSIS — E876 Hypokalemia: Secondary | ICD-10-CM

## 2024-06-29 DIAGNOSIS — Z1329 Encounter for screening for other suspected endocrine disorder: Secondary | ICD-10-CM

## 2024-06-29 DIAGNOSIS — E611 Iron deficiency: Secondary | ICD-10-CM

## 2024-06-29 DIAGNOSIS — I10 Essential (primary) hypertension: Secondary | ICD-10-CM

## 2024-06-29 DIAGNOSIS — E782 Mixed hyperlipidemia: Secondary | ICD-10-CM

## 2024-06-29 DIAGNOSIS — Z0000 Encounter for general adult medical examination without abnormal findings: Secondary | ICD-10-CM

## 2024-06-29 MED ORDER — LOSARTAN POTASSIUM-HCTZ 50-12.5 MG PO TABS: 1.0000 | ORAL_TABLET | Freq: Every day | ORAL | 3 refills | Status: AC

## 2024-06-29 MED ORDER — POTASSIUM CHLORIDE CRYS ER 20 MEQ PO TBCR: 20.0000 meq | EXTENDED_RELEASE_TABLET | Freq: Two times a day (BID) | ORAL | 3 refills | Status: AC

## 2024-06-29 MED ORDER — ROSUVASTATIN CALCIUM 10 MG PO TABS: 10.0000 mg | ORAL_TABLET | Freq: Every day | ORAL | 3 refills | Status: AC

## 2024-06-29 NOTE — Progress Notes (Signed)
 " Leron Glance, NP-C Phone: [phone]  Savannah Bond is a 59 y.o. female who presents today for annual exam.   Discussed the use of AI scribe software for clinical note transcription with the patient, who gave verbal consent to proceed.  History of Present Illness   Savannah Bond is a 59 year old female who presents for an annual physical exam.  She has not been taking her medications regularly, including Crestor  for cholesterol, losartan , hydrochlorothiazide , and amlodipine  for blood pressure, and potassium supplements. Her pill organizer got wet, leading to inconsistent medication intake.  She experiences increased urination, attributing it to menopause and increased water  intake. Previously, she could go all day without urinating, but now she cannot sleep for ten to twenty minutes without needing to urinate. No burning during urination.  She has been exercising more, using a stair stepper and walking, resulting in weight loss from 250 pounds to 230 pounds. She has a gym in her new residence and exercises with a friend. Her diet includes fruits and vegetables but is 'a little sweet heavy.' She cooks at home.  She has not taken her potassium supplements, describing them as 'terrible to take' due to their size. She has tried dissolving them in water  or orange juice.  No chest pain, shortness of breath, abdominal pain, constipation, diarrhea, headaches, dizziness, trouble swallowing, skin changes, rashes, swelling, anxiety, or depression. Her sleep has improved, and she feels better overall, attributing this to exercise.      Tobacco Use History[1]  Medications Ordered Prior to Encounter[2]   ROS see history of present illness  Objective  Physical Exam Vitals:   06/29/24 1323 06/29/24 1326  BP: (!) 138/100 (!) 138/98  Pulse: 70   Temp: 98.6 F (37 C)   SpO2: 98%     BP Readings from Last 3 Encounters:  06/29/24 (!) 138/98  01/07/24 130/86  11/19/23 (!) 139/93   Wt  Readings from Last 3 Encounters:  06/29/24 230 lb 9.6 oz (104.6 kg)  01/07/24 223 lb 6.4 oz (101.3 kg)  11/19/23 224 lb (101.6 kg)    Physical Exam Constitutional:      General: She is not in acute distress.    Appearance: Normal appearance. She is obese.  HENT:     Head: Normocephalic.     Right Ear: Tympanic membrane normal.     Left Ear: Tympanic membrane normal.     Nose: Nose normal.     Mouth/Throat:     Mouth: Mucous membranes are moist.     Pharynx: Oropharynx is clear.  Eyes:     Conjunctiva/sclera: Conjunctivae normal.     Pupils: Pupils are equal, round, and reactive to light.  Neck:     Thyroid : No thyromegaly.  Cardiovascular:     Rate and Rhythm: Normal rate and regular rhythm.     Heart sounds: Normal heart sounds.  Pulmonary:     Effort: Pulmonary effort is normal.     Breath sounds: Normal breath sounds.  Abdominal:     General: Abdomen is flat. Bowel sounds are normal.     Palpations: Abdomen is soft. There is no mass.     Tenderness: There is no abdominal tenderness.  Musculoskeletal:        General: Normal range of motion.  Lymphadenopathy:     Cervical: No cervical adenopathy.  Skin:    General: Skin is warm and dry.     Findings: No rash.  Neurological:     General: No focal deficit  present.     Mental Status: She is alert.  Psychiatric:        Mood and Affect: Mood normal.        Behavior: Behavior normal.      Assessment/Plan: Please see individual problem list.  Routine general medical examination at a health care facility Assessment & Plan: Physical exam complete. We will check lab work as outlined. Pap smear no longer indicated, s/p hysterectomy. Colonoscopy and mammogram are up to date. Flu and tetanus vaccines are up to date. Declines additional COVID vaccines. She has completed the Shingles vaccine series. She does not smoke, drink alcohol, or use drugs. Regular dental and eye check-ups are maintained. Her diet is well-balanced,  though there is room for reduced sweets. Regular exercise is part of her routine. Continue regular dental and eye check-ups. Encourage reduction of sweets in the diet and continuation of regular exercise. Return to care in 6 weeks.    Primary hypertension Assessment & Plan: Blood pressure is elevated due to non-adherence to antihypertensive medications. Medication adherence was discussed to prevent complications such as kidney damage, stroke, and heart attack. The risks of prolonged elevated blood pressure and its asymptomatic nature until severe complications were explained. The benefits of weight loss were also discussed. Restart losartan  and hydrochlorothiazide  50 -12.5 mg daily. Monitor blood pressure regularly. Encourage weight loss, heart healthy diet and regular exercise. Follow up in 6 weeks.   Orders: -     Losartan  Potassium-HCTZ; Take 1 tablet by mouth daily.  Dispense: 90 tablet; Refill: 3  Mixed hyperlipidemia Assessment & Plan: Non-adherence to Crestor  was noted. Cholesterol management was discussed to reduce cardiovascular risk, and the benefits of Crestor  were explained. Restart Crestor  10 mg daily. Monitor cholesterol levels after resuming medication.  Orders: -     Rosuvastatin  Calcium ; Take 1 tablet (10 mg total) by mouth daily.  Dispense: 90 tablet; Refill: 3 -     Lipid panel  Hypokalemia Assessment & Plan: Secondary to hydrochlorothiazide . She has not been taking supplement or her hydrochlorothiazide . Starting back on medications. Counseled on importance of medication adherence. Check CMP. We will continue to monitor.   Orders: -     Potassium Chloride  Crys ER; Take 1 tablet (20 mEq total) by mouth 2 (two) times daily.  Dispense: 180 tablet; Refill: 3 -     Comprehensive metabolic panel with GFR  Iron deficiency Assessment & Plan: Non-adherence to iron supplementation was noted. Asymptomatic. Restart iron supplementation. Follow up with Hematology.   Orders: -      CBC with Differential/Platelet -     IBC + Ferritin  Morbid obesity with BMI of 40.0-44.9, adult [facility]) Assessment & Plan: Encourage healthy diet and regular exercise. Check A1c. Counseled on health benefits of weight loss.   Orders: -     Hemoglobin A1c  Thyroid  disorder screen -     TSH  Vitamin D deficiency -     VITAMIN D 25 Hydroxy (Vit-D Deficiency, Fractures)      Return in about 6 weeks (around 08/10/2024) for Follow up.   Leron Glance, NP-C [facility] [place]      [1]  Social History Tobacco Use  Smoking Status Never  Smokeless Tobacco Never  [2]  No current outpatient medications on file prior to visit.   No current facility-administered medications on file prior to visit.   "

## 2024-06-30 LAB — TSH: TSH: 0.81 u[IU]/mL (ref 0.35–5.50)

## 2024-06-30 LAB — COMPREHENSIVE METABOLIC PANEL WITH GFR
ALT: 14 U/L (ref 3–35)
AST: 15 U/L (ref 5–37)
Albumin: 4.3 g/dL (ref 3.5–5.2)
Alkaline Phosphatase: 85 U/L (ref 39–117)
BUN: 10 mg/dL (ref 6–23)
CO2: 30 meq/L (ref 19–32)
Calcium: 9.3 mg/dL (ref 8.4–10.5)
Chloride: 101 meq/L (ref 96–112)
Creatinine, Ser: 0.82 mg/dL (ref 0.40–1.20)
GFR: 79.37 mL/min
Glucose, Bld: 76 mg/dL (ref 70–99)
Potassium: 3.7 meq/L (ref 3.5–5.1)
Sodium: 139 meq/L (ref 135–145)
Total Bilirubin: 0.6 mg/dL (ref 0.2–1.2)
Total Protein: 7.1 g/dL (ref 6.0–8.3)

## 2024-06-30 LAB — VITAMIN D 25 HYDROXY (VIT D DEFICIENCY, FRACTURES): VITD: 18.38 ng/mL — ABNORMAL LOW (ref 30.00–100.00)

## 2024-06-30 LAB — LIPID PANEL
Cholesterol: 181 mg/dL (ref 28–200)
HDL: 55 mg/dL
LDL Cholesterol: 100 mg/dL — ABNORMAL HIGH (ref 10–99)
NonHDL: 125.91
Total CHOL/HDL Ratio: 3
Triglycerides: 129 mg/dL (ref 10.0–149.0)
VLDL: 25.8 mg/dL (ref 0.0–40.0)

## 2024-06-30 LAB — CBC WITH DIFFERENTIAL/PLATELET
Basophils Absolute: 0.1 10*3/uL (ref 0.0–0.1)
Basophils Relative: 1 % (ref 0.0–3.0)
Eosinophils Absolute: 0.1 10*3/uL (ref 0.0–0.7)
Eosinophils Relative: 1.6 % (ref 0.0–5.0)
HCT: 42.2 % (ref 36.0–46.0)
Hemoglobin: 13.5 g/dL (ref 12.0–15.0)
Lymphocytes Relative: 43.7 % (ref 12.0–46.0)
Lymphs Abs: 2.4 10*3/uL (ref 0.7–4.0)
MCHC: 32 g/dL (ref 30.0–36.0)
MCV: 79.8 fl (ref 78.0–100.0)
Monocytes Absolute: 0.4 10*3/uL (ref 0.1–1.0)
Monocytes Relative: 6.5 % (ref 3.0–12.0)
Neutro Abs: 2.6 10*3/uL (ref 1.4–7.7)
Neutrophils Relative %: 47.2 % (ref 43.0–77.0)
Platelets: 286 10*3/uL (ref 150.0–400.0)
RBC: 5.29 Mil/uL — ABNORMAL HIGH (ref 3.87–5.11)
RDW: 16.4 % — ABNORMAL HIGH (ref 11.5–15.5)
WBC: 5.6 10*3/uL (ref 4.0–10.5)

## 2024-06-30 LAB — IBC + FERRITIN
Ferritin: 36.5 ng/mL (ref 10.0–291.0)
Iron: 41 ug/dL — ABNORMAL LOW (ref 42–145)
Saturation Ratios: 12.4 % — ABNORMAL LOW (ref 20.0–50.0)
TIBC: 330.4 ug/dL (ref 250.0–450.0)
Transferrin: 236 mg/dL (ref 212.0–360.0)

## 2024-06-30 LAB — HEMOGLOBIN A1C: Hgb A1c MFr Bld: 5.3 % (ref 4.6–6.5)

## 2024-07-07 ENCOUNTER — Ambulatory Visit: Payer: Self-pay | Admitting: Nurse Practitioner

## 2024-07-08 ENCOUNTER — Encounter: Payer: Self-pay | Admitting: Nurse Practitioner

## 2024-07-08 NOTE — Assessment & Plan Note (Addendum)
 Encourage healthy diet and regular exercise. Check A1c. Counseled on health benefits of weight loss.

## 2024-07-08 NOTE — Assessment & Plan Note (Signed)
 Non-adherence to Crestor  was noted. Cholesterol management was discussed to reduce cardiovascular risk, and the benefits of Crestor  were explained. Restart Crestor  10 mg daily. Monitor cholesterol levels after resuming medication.

## 2024-07-08 NOTE — Assessment & Plan Note (Signed)
 Secondary to hydrochlorothiazide . She has not been taking supplement or her hydrochlorothiazide . Starting back on medications. Counseled on importance of medication adherence. Check CMP. We will continue to monitor.

## 2024-07-08 NOTE — Assessment & Plan Note (Signed)
 Non-adherence to iron supplementation was noted. Asymptomatic. Restart iron supplementation. Follow up with Hematology.

## 2024-07-08 NOTE — Assessment & Plan Note (Signed)
 Blood pressure is elevated due to non-adherence to antihypertensive medications. Medication adherence was discussed to prevent complications such as kidney damage, stroke, and heart attack. The risks of prolonged elevated blood pressure and its asymptomatic nature until severe complications were explained. The benefits of weight loss were also discussed. Restart losartan  and hydrochlorothiazide  50 -12.5 mg daily. Monitor blood pressure regularly. Encourage weight loss, heart healthy diet and regular exercise. Follow up in 6 weeks.

## 2024-07-08 NOTE — Assessment & Plan Note (Signed)
 Physical exam complete. We will check lab work as outlined. Pap smear no longer indicated, s/p hysterectomy. Colonoscopy and mammogram are up to date. Flu and tetanus vaccines are up to date. Declines additional COVID vaccines. She has completed the Shingles vaccine series. She does not smoke, drink alcohol, or use drugs. Regular dental and eye check-ups are maintained. Her diet is well-balanced, though there is room for reduced sweets. Regular exercise is part of her routine. Continue regular dental and eye check-ups. Encourage reduction of sweets in the diet and continuation of regular exercise. Return to care in 6 weeks.

## 2024-08-10 ENCOUNTER — Ambulatory Visit: Admitting: Nurse Practitioner
